# Patient Record
Sex: Male | Born: 1965 | Race: White | Hispanic: No | Marital: Married | Smoking: Never smoker
Health system: Southern US, Community
[De-identification: ages and names within clinical notes are randomized; demographics above are authoritative.]

## PROBLEM LIST (undated history)

## (undated) DIAGNOSIS — K559 Vascular disorder of intestine, unspecified: Secondary | ICD-10-CM

## (undated) DIAGNOSIS — Z98811 Dental restoration status: Secondary | ICD-10-CM

## (undated) DIAGNOSIS — Z972 Presence of dental prosthetic device (complete) (partial): Secondary | ICD-10-CM

## (undated) DIAGNOSIS — M94229 Chondromalacia, unspecified elbow: Secondary | ICD-10-CM

## (undated) DIAGNOSIS — M1990 Unspecified osteoarthritis, unspecified site: Secondary | ICD-10-CM

## (undated) DIAGNOSIS — IMO0002 Reserved for concepts with insufficient information to code with codable children: Secondary | ICD-10-CM

## (undated) DIAGNOSIS — M47817 Spondylosis without myelopathy or radiculopathy, lumbosacral region: Secondary | ICD-10-CM

## (undated) DIAGNOSIS — M67921 Unspecified disorder of synovium and tendon, right upper arm: Secondary | ICD-10-CM

## (undated) HISTORY — PX: HERNIA REPAIR: SHX51

## (undated) HISTORY — PX: INGUINAL HERNIA REPAIR: SHX194

---

## 2013-01-12 ENCOUNTER — Other Ambulatory Visit: Payer: Self-pay | Admitting: Neurosurgery

## 2013-01-12 DIAGNOSIS — M545 Low back pain: Secondary | ICD-10-CM

## 2013-01-18 ENCOUNTER — Ambulatory Visit
Admission: RE | Admit: 2013-01-18 | Discharge: 2013-01-18 | Disposition: A | Discharge status: Home / Self Care | Payer: 59 | Source: Ambulatory Visit | Attending: Neurosurgery | Admitting: Neurosurgery

## 2013-01-18 DIAGNOSIS — M545 Low back pain: Secondary | ICD-10-CM

## 2013-06-14 DIAGNOSIS — M67921 Unspecified disorder of synovium and tendon, right upper arm: Secondary | ICD-10-CM

## 2013-06-14 DIAGNOSIS — M94229 Chondromalacia, unspecified elbow: Secondary | ICD-10-CM

## 2013-06-14 HISTORY — DX: Unspecified disorder of synovium and tendon, right upper arm: M67.921

## 2013-06-14 HISTORY — DX: Chondromalacia, unspecified elbow: M94.229

## 2013-06-30 ENCOUNTER — Other Ambulatory Visit: Payer: Self-pay | Admitting: Orthopedic Surgery

## 2013-06-30 ENCOUNTER — Encounter (HOSPITAL_BASED_OUTPATIENT_CLINIC_OR_DEPARTMENT_OTHER): Payer: Self-pay | Admitting: *Deleted

## 2013-07-05 NOTE — H&P (Signed)
Kyle Ford is an 47 y.o. male.    Chief Complaint: c/o chronic and progressive lateral epicondyle pain right elbow HPI: Kyle Ford presents for evaluation of chronic right lateral elbow pain. 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 He has had severe pain at the right lateral elbow consistent with a tear of the extensor carpi radialis brevis dating back to September 2013.   At that time he was using an aerator on his yard and severely strained his elbow. He has had a very lengthy history of treatment for this. He initially presented to your office and was treated with ice, Meloxicam followed by Naproxen. He did physical therapy and has used a tennis elbow counterforce brace. He subsequently had a steroid injection by Dr. Burr Medico in December of 2013. He had pain improvement for 3 months but now has recurrent severe pain. He cannot shake hands, he cannot lift objects with his hand in a neutral or pronated position. He has had extensive physical therapy to his back, he has done an Therapist, art and has been trying also exercises advised by the Internet without relief. He has a good friend from [place] who has been a patient in our office with good results following nonoperative care of a tennis elbow predicament. He seeks an upper extremity orthopaedic consult.  Past Medical History  Diagnosis Date  . 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 Dental crowns present   . Dental bridge present     lower  . Bulging discs     lumbar  . Ischemic colitis     Past Surgical History  Procedure Laterality Date  . Inguinal hernia repair Bilateral 1980s    History reviewed. No pertinent family history. Social History:  reports that he has never smoked. He has never used smokeless tobacco. He reports that he does not drink alcohol or use illicit drugs.  Allergies:  Allergies  Allergen  Reactions  . Asa [Aspirin] Other (See Comments)    EPISTAXIS  . Penicillins Hives    No prescriptions prior to admission    No results found for this or any previous visit (from the past 48 hour(s)).  No results found.   Pertinent items are noted in HPI.  Height 5\' 6"  (1.676 m), weight 83.915 kg (185 lb).  General appearance: alert Head: Normocephalic, without obvious abnormality Neck: supple, symmetrical, trachea midline Resp: clear to auscultation bilaterally Cardio: regular rate and rhythm GI: normal findings: bowel sounds normal Extremities:  Inspection of his arm reveals no deformity. He is quite reluctant to extend the last 5 degrees to full extension. ROM with pain is 0/140 degrees. He has full pronation/supination. He has markedly painful middle finger extension test. He has pain with resisted wrist extension, long finger extension. His pain is relieved by elbow flexion at 140 degrees. Kyle Jacobson, MD He does not have pain with supinated lifting. He does not have pain on the left. His fist position, elbow extended wrist flexion is 30 degrees right and only 20 degrees of external rotation vs 70 degrees neutral approximately 50 degrees external rotation left. His neurovascular exam is intact.  X-rays of his left elbow demonstrates very slight new bone formation at the lateral epicondyle. His humeral ulnar and radiocapitellar joints are normal in appearance.  MRI of his right elbow. This was accomplished at [facility] on 06-22-13. He is noted to have cavitary  tendinopathy of his common extensor origin. He has grade I chondromalacia of the capitellum.  Pulses: 2+ and symmetric Skin: normal Neurologic: Grossly normal    Assessment/Plan Impression: Right lateral epicondylitis with irregular capitellum, rule out arthritis of radio capitellar joint.  Plan:  Reconstruction extensor origin right lateral elbow with microfracture capitellum.The procedure, risks,benefits and  post-op course were discussed with the patient at length and they were in agreement with the plan.  DASNOIT,Keana Dueitt J 07/05/2013, 10:01 PM  H&P documentation: 07/06/2013  -History and Physical Reviewed  -Patient has been re-examined  -No change in the plan of care  Wyn Forster, MD

## 2013-07-06 ENCOUNTER — Encounter (HOSPITAL_BASED_OUTPATIENT_CLINIC_OR_DEPARTMENT_OTHER)
Admission: RE | Disposition: A | Discharge status: Home / Self Care | Payer: Self-pay | Source: Ambulatory Visit | Attending: Orthopedic Surgery

## 2013-07-06 ENCOUNTER — Encounter (HOSPITAL_BASED_OUTPATIENT_CLINIC_OR_DEPARTMENT_OTHER): Payer: 59 | Admitting: Anesthesiology

## 2013-07-06 ENCOUNTER — Ambulatory Visit (HOSPITAL_BASED_OUTPATIENT_CLINIC_OR_DEPARTMENT_OTHER)
Admission: RE | Admit: 2013-07-06 | Discharge: 2013-07-06 | Disposition: A | Discharge status: Home / Self Care | Payer: 59 | Source: Ambulatory Visit | Attending: Orthopedic Surgery | Admitting: Orthopedic Surgery

## 2013-07-06 ENCOUNTER — Ambulatory Visit (HOSPITAL_BASED_OUTPATIENT_CLINIC_OR_DEPARTMENT_OTHER): Payer: 59 | Admitting: Anesthesiology

## 2013-07-06 ENCOUNTER — Encounter (HOSPITAL_BASED_OUTPATIENT_CLINIC_OR_DEPARTMENT_OTHER): Payer: Self-pay | Admitting: Anesthesiology

## 2013-07-06 DIAGNOSIS — M6580 Other synovitis and tenosynovitis, unspecified site: Secondary | ICD-10-CM | POA: Insufficient documentation

## 2013-07-06 DIAGNOSIS — M9420 Chondromalacia, unspecified site: Secondary | ICD-10-CM | POA: Insufficient documentation

## 2013-07-06 HISTORY — DX: Chondromalacia, unspecified elbow: M94.229

## 2013-07-06 HISTORY — DX: Presence of dental prosthetic device (complete) (partial): Z97.2

## 2013-07-06 HISTORY — DX: Unspecified disorder of synovium and tendon, right upper arm: M67.921

## 2013-07-06 HISTORY — DX: Vascular disorder of intestine, unspecified: K55.9

## 2013-07-06 HISTORY — DX: Dental restoration status: Z98.811

## 2013-07-06 HISTORY — PX: TENDON RECONSTRUCTION: SHX2487

## 2013-07-06 HISTORY — DX: Reserved for concepts with insufficient information to code with codable children: IMO0002

## 2013-07-06 SURGERY — RECONSTRUCTION, TENDON OR LIGAMENT, ELBOW
Anesthesia: General | Site: Elbow | Laterality: Right | Wound class: Clean

## 2013-07-06 MED ORDER — BUPIVACAINE-EPINEPHRINE PF 0.5-1:200000 % IJ SOLN
INTRAMUSCULAR | Status: DC | PRN
  Administered 2013-07-06: 35 mL

## 2013-07-06 MED ORDER — HYDROMORPHONE HCL 2 MG PO TABS: 2.0000 mg | ORAL_TABLET | ORAL | Status: DC | PRN

## 2013-07-06 MED ORDER — PROPOFOL 10 MG/ML IV BOLUS
INTRAVENOUS | Status: DC | PRN
  Administered 2013-07-06: 180 mg via INTRAVENOUS

## 2013-07-06 MED ORDER — FENTANYL CITRATE 0.05 MG/ML IJ SOLN
INTRAMUSCULAR | Status: DC | PRN
  Administered 2013-07-06: 100 ug via INTRAVENOUS

## 2013-07-06 MED ORDER — LACTATED RINGERS IV SOLN
INTRAVENOUS | Status: DC
  Administered 2013-07-06 (×2): via INTRAVENOUS

## 2013-07-06 MED ORDER — LIDOCAINE HCL 2 % IJ SOLN
INTRAMUSCULAR | Status: AC
  Filled 2013-07-06: qty 20

## 2013-07-06 MED ORDER — CHLORHEXIDINE GLUCONATE 4 % EX LIQD: 60.0000 mL | Freq: Once | CUTANEOUS | Status: DC

## 2013-07-06 MED ORDER — OXYCODONE HCL 5 MG PO TABS: 5.0000 mg | ORAL_TABLET | Freq: Once | ORAL | Status: DC | PRN

## 2013-07-06 MED ORDER — ONDANSETRON HCL 4 MG/2ML IJ SOLN
INTRAMUSCULAR | Status: DC | PRN
  Administered 2013-07-06: 4 mg via INTRAVENOUS

## 2013-07-06 MED ORDER — MIDAZOLAM HCL 2 MG/2ML IJ SOLN
1.0000 mg | INTRAMUSCULAR | Status: DC | PRN
Start: 2013-07-06 — End: 2013-07-06
  Administered 2013-07-06: 2 mg via INTRAVENOUS

## 2013-07-06 MED ORDER — DOXYCYCLINE HYCLATE 100 MG PO TABS: 100.0000 mg | ORAL_TABLET | Freq: Two times a day (BID) | ORAL | Status: AC

## 2013-07-06 MED ORDER — MIDAZOLAM HCL 2 MG/ML PO SYRP: 12.0000 mg | ORAL_SOLUTION | Freq: Once | ORAL | Status: DC | PRN

## 2013-07-06 MED ORDER — MIDAZOLAM HCL 5 MG/5ML IJ SOLN
INTRAMUSCULAR | Status: DC | PRN
  Administered 2013-07-06: 2 mg via INTRAVENOUS

## 2013-07-06 MED ORDER — PROPOFOL 10 MG/ML IV EMUL
INTRAVENOUS | Status: AC
  Filled 2013-07-06: qty 50

## 2013-07-06 MED ORDER — DEXAMETHASONE SODIUM PHOSPHATE 10 MG/ML IJ SOLN
INTRAMUSCULAR | Status: DC | PRN
  Administered 2013-07-06: 5 mg via INTRAVENOUS

## 2013-07-06 MED ORDER — HYDROMORPHONE HCL PF 1 MG/ML IJ SOLN: 0.2500 mg | INTRAMUSCULAR | Status: DC | PRN

## 2013-07-06 MED ORDER — OXYCODONE HCL 5 MG/5ML PO SOLN: 5.0000 mg | Freq: Once | ORAL | Status: DC | PRN

## 2013-07-06 MED ORDER — GLYCOPYRROLATE 0.2 MG/ML IJ SOLN: 0.1000 mg | Freq: Once | INTRAMUSCULAR | Status: DC

## 2013-07-06 MED ORDER — MIDAZOLAM HCL 2 MG/2ML IJ SOLN
INTRAMUSCULAR | Status: AC
  Filled 2013-07-06: qty 2

## 2013-07-06 MED ORDER — LIDOCAINE HCL (CARDIAC) 20 MG/ML IV SOLN
INTRAVENOUS | Status: DC | PRN
  Administered 2013-07-06: 80 mg via INTRAVENOUS

## 2013-07-06 MED ORDER — GLYCOPYRROLATE 0.2 MG/ML IJ SOLN
0.2000 mg | Freq: Once | INTRAMUSCULAR | Status: AC | PRN
  Administered 2013-07-06: 0.2 mg via INTRAVENOUS

## 2013-07-06 MED ORDER — PROMETHAZINE HCL 25 MG/ML IJ SOLN: 6.2500 mg | INTRAMUSCULAR | Status: DC | PRN

## 2013-07-06 MED ORDER — FENTANYL CITRATE 0.05 MG/ML IJ SOLN
INTRAMUSCULAR | Status: AC
  Filled 2013-07-06: qty 4

## 2013-07-06 MED ORDER — VANCOMYCIN HCL IN DEXTROSE 1-5 GM/200ML-% IV SOLN
INTRAVENOUS | Status: AC
  Filled 2013-07-06: qty 200

## 2013-07-06 MED ORDER — FENTANYL CITRATE 0.05 MG/ML IJ SOLN: 50.0000 ug | Freq: Once | INTRAMUSCULAR | Status: DC

## 2013-07-06 MED ORDER — DEXAMETHASONE SODIUM PHOSPHATE 4 MG/ML IJ SOLN
INTRAMUSCULAR | Status: DC | PRN
  Administered 2013-07-06: 4 mg

## 2013-07-06 MED ORDER — FENTANYL CITRATE 0.05 MG/ML IJ SOLN
50.0000 ug | INTRAMUSCULAR | Status: DC | PRN
  Administered 2013-07-06: 100 ug via INTRAVENOUS

## 2013-07-06 MED ORDER — MIDAZOLAM HCL 2 MG/2ML IJ SOLN
1.0000 mg | INTRAMUSCULAR | Status: DC | PRN
Start: 2013-07-06 — End: 2013-07-06

## 2013-07-06 SURGICAL SUPPLY — 82 items
BANDAGE ADHESIVE 1X3 (GAUZE/BANDAGES/DRESSINGS) IMPLANT
BANDAGE CONFORM 3  STR LF (GAUZE/BANDAGES/DRESSINGS) IMPLANT
BANDAGE ELASTIC 3 VELCRO ST LF (GAUZE/BANDAGES/DRESSINGS) ×2 IMPLANT
BANDAGE ELASTIC 4 VELCRO ST LF (GAUZE/BANDAGES/DRESSINGS) ×2 IMPLANT
BANDAGE GAUZE ELAST BULKY 4 IN (GAUZE/BANDAGES/DRESSINGS) ×2 IMPLANT
BLADE MINI RND TIP GREEN BEAV (BLADE) ×2 IMPLANT
BLADE SURG 15 STRL LF DISP TIS (BLADE) ×2 IMPLANT
BLADE SURG 15 STRL SS (BLADE) ×2
BNDG COHESIVE 3X5 TAN STRL LF (GAUZE/BANDAGES/DRESSINGS) IMPLANT
BNDG COHESIVE 4X5 TAN STRL (GAUZE/BANDAGES/DRESSINGS) IMPLANT
BNDG ESMARK 4X9 LF (GAUZE/BANDAGES/DRESSINGS) ×2 IMPLANT
BRUSH SCRUB EZ PLAIN DRY (MISCELLANEOUS) ×2 IMPLANT
CORDS BIPOLAR (ELECTRODE) ×2 IMPLANT
COVER MAYO STAND STRL (DRAPES) ×2 IMPLANT
COVER TABLE BACK 60X90 (DRAPES) ×2 IMPLANT
CUFF TOURNIQUET SINGLE 18IN (TOURNIQUET CUFF) IMPLANT
DECANTER SPIKE VIAL GLASS SM (MISCELLANEOUS) IMPLANT
DRAIN PENROSE 1/4X12 LTX STRL (WOUND CARE) IMPLANT
DRAPE EXTREMITY T 121X128X90 (DRAPE) ×2 IMPLANT
DRAPE INCISE IOBAN 66X45 STRL (DRAPES) IMPLANT
DRAPE OEC MINIVIEW 54X84 (DRAPES) IMPLANT
DRAPE SURG 17X23 STRL (DRAPES) ×2 IMPLANT
DRSG PAD ABDOMINAL 8X10 ST (GAUZE/BANDAGES/DRESSINGS) IMPLANT
DRSG TEGADERM 2-3/8X2-3/4 SM (GAUZE/BANDAGES/DRESSINGS) IMPLANT
DRSG TEGADERM 4X4.75 (GAUZE/BANDAGES/DRESSINGS) IMPLANT
GAUZE SPONGE 4X4 16PLY XRAY LF (GAUZE/BANDAGES/DRESSINGS) IMPLANT
GAUZE XEROFORM 1X8 LF (GAUZE/BANDAGES/DRESSINGS) IMPLANT
GLOVE BIO SURGEON STRL SZ 6.5 (GLOVE) ×2 IMPLANT
GLOVE BIOGEL M STRL SZ7.5 (GLOVE) ×2 IMPLANT
GLOVE BIOGEL PI IND STRL 7.0 (GLOVE) ×2 IMPLANT
GLOVE BIOGEL PI INDICATOR 7.0 (GLOVE) ×2
GLOVE ORTHO TXT STRL SZ7.5 (GLOVE) ×2 IMPLANT
GOWN BRE IMP PREV XXLGXLNG (GOWN DISPOSABLE) ×4 IMPLANT
GOWN PREVENTION PLUS XLARGE (GOWN DISPOSABLE) ×2 IMPLANT
K-WIRE .035X4 (WIRE) IMPLANT
K-WIRE .045X4 (WIRE) IMPLANT
K-WIRE .062X4 (WIRE) IMPLANT
KIT BIO-TENODESIS 3X8 DISP (MISCELLANEOUS)
KIT INSRT BABSR STRL DISP BTN (MISCELLANEOUS) IMPLANT
NDL SUT 6 .5 CRC .975X.05 MAYO (NEEDLE) IMPLANT
NEEDLE 27GAX1X1/2 (NEEDLE) IMPLANT
NEEDLE FISTULA 1/2 CIRCLE (NEEDLE) IMPLANT
NEEDLE KEITH (NEEDLE) IMPLANT
NEEDLE KEITH SZ10 STRAIGHT (NEEDLE) IMPLANT
NEEDLE MAYO TAPER (NEEDLE)
PACK BASIN DAY SURGERY FS (CUSTOM PROCEDURE TRAY) ×2 IMPLANT
PAD CAST 3X4 CTTN HI CHSV (CAST SUPPLIES) ×1 IMPLANT
PAD CAST 4YDX4 CTTN HI CHSV (CAST SUPPLIES) IMPLANT
PADDING CAST ABS 4INX4YD NS (CAST SUPPLIES) ×1
PADDING CAST ABS COTTON 4X4 ST (CAST SUPPLIES) ×1 IMPLANT
PADDING CAST COTTON 3X4 STRL (CAST SUPPLIES) ×1
PADDING CAST COTTON 4X4 STRL (CAST SUPPLIES)
PASSER SUT SWANSON 36MM LOOP (INSTRUMENTS) IMPLANT
SLEEVE SCD COMPRESS KNEE MED (MISCELLANEOUS) IMPLANT
SLING ARM FOAM STRAP LRG (SOFTGOODS) IMPLANT
SPLINT PLASTER CAST XFAST 3X15 (CAST SUPPLIES) ×5 IMPLANT
SPLINT PLASTER XTRA FASTSET 3X (CAST SUPPLIES) ×5
SPONGE GAUZE 4X4 12PLY (GAUZE/BANDAGES/DRESSINGS) ×2 IMPLANT
STOCKINETTE 4X48 STRL (DRAPES) ×2 IMPLANT
STRIP CLOSURE SKIN 1/2X4 (GAUZE/BANDAGES/DRESSINGS) IMPLANT
SUT ETHIBOND 2 OS 4 DA (SUTURE) IMPLANT
SUT FIBERWIRE #2 38 T-5 BLUE (SUTURE)
SUT FIBERWIRE 2-0 18 17.9 3/8 (SUTURE)
SUT FIBERWIRE 3-0 18 TAPR NDL (SUTURE)
SUT PROLENE 3 0 PS 2 (SUTURE) IMPLANT
SUT SILK 2 0 FS (SUTURE) IMPLANT
SUT VIC AB 2-0 SH 27 (SUTURE)
SUT VIC AB 2-0 SH 27XBRD (SUTURE) IMPLANT
SUT VIC AB 3-0 SH 27 (SUTURE)
SUT VIC AB 3-0 SH 27X BRD (SUTURE) IMPLANT
SUT VIC AB 4-0 P-3 18XBRD (SUTURE) IMPLANT
SUT VIC AB 4-0 P3 18 (SUTURE)
SUT VICRYL 4-0 PS2 18IN ABS (SUTURE) IMPLANT
SUTURE FIBERWR #2 38 T-5 BLUE (SUTURE) IMPLANT
SUTURE FIBERWR 2-0 18 17.9 3/8 (SUTURE) IMPLANT
SUTURE FIBERWR 3-0 18 TAPR NDL (SUTURE) IMPLANT
SYR 3ML 23GX1 SAFETY (SYRINGE) IMPLANT
SYR BULB 3OZ (MISCELLANEOUS) ×2 IMPLANT
SYR CONTROL 10ML LL (SYRINGE) IMPLANT
TOWEL OR 17X24 6PK STRL BLUE (TOWEL DISPOSABLE) ×4 IMPLANT
TUBE CONNECTING 20X1/4 (TUBING) IMPLANT
UNDERPAD 30X30 INCONTINENT (UNDERPADS AND DIAPERS) ×2 IMPLANT

## 2013-07-06 NOTE — Op Note (Signed)
NAME:  Kyle Ford, Kyle NO.:  [id number]  MEDICAL RECORD NO.:  [id number]  LOCATION:                                 FACILITY:  PHYSICIAN:  Katy Fitch. Jailey Booton, M.D.      DATE OF BIRTH:  DATE OF PROCEDURE:  07/06/2013 DATE OF DISCHARGE:                              OPERATIVE REPORT   PREOPERATIVE DIAGNOSES:  Chronic cavitary tendinopathy, right elbow, extensor carpi radialis brevis documented with MRI, also MRI evidence of chondromalacia of capitellum.  POSTOPERATIVE DIAGNOSES:  Minimal chondromalacia of capitellum and cavitary tendinopathy of common extensor origin documented with digital photography.  OPERATION:  Right lateral elbow arthrotomy with inspection of capitellum and proximal radial head articular cartilage followed by debridement of necrotic extensor tendon origin and reconstruction of extensor carpi radialis longus and brevis to decorticated and drilled lateral epicondyle with through bone Kevlar sutures and repair of fascia.  OPERATING SURGEON:  Katy Fitch. Shermon Bozzi, MD  ASSISTANT:  Marveen Reeks Dasnoit, PA-C  ANESTHESIA:  General by LMA supplemented by a ropivacaine right plexus block.  SUPERVISING ANESTHESIOLOGIST:  Bedelia Person, M.D.  INDICATIONS:  Kyle Ford is a 47 year old gentleman referred from [facility] for evaluation of chronic lateral elbow pain predicament.  He had been evaluated by his primary care physician in [facility] and had a single steroid injection.  He tolerated these symptoms for nearly 1 year.  He came for consult and was advised to begin an extensor carpi radialis longus and brevis stretching program and proper lifting.  After 3 months, his pain was not relieved.  A MRI was obtained, which revealed significant cavitary tendinopathy of the right common extensor origin. Due to this predicament, we recommended proceeding with primary repair of the common origin with drilling and decortication.  Due to the MRI findings of  probable chondromalacia of the capitellum, we recommended arthrotomy to check the capitellum and consider possible micro fracture.  After informed consent, he was brought to the operating room at this time.  DESCRIPTION OF PROCEDURE:  Kyle Ford was brought to the operating room, placed in supine position on the operating table.  Dr. Gypsy Balsam provided detailed anesthesia and informed consent and recommended a preoperative ropivacaine plexus block for perioperative pain management.  This was placed in the holding area without complication using ultrasound guidance.  Questions were invited and answered in detail in the holding area followed by marking the right arm as a proper surgical site per identification protocol with a marking pen.  Kyle Ford was then brought to room 1 of the [facility], placed in supine position on the operating table and general anesthesia by LMA technique induced under Dr. Burnett Corrente direct supervision.  A 1 g of vancomycin was slowly provided over 90 minutes beginning in the holding area.  Following routine Betadine scrub and paint of the right upper extremity, sterile stockinette and impervious arthroscopy drapes were applied.  The arm was then exsanguinated with Esmarch bandage and arterial tourniquet inflated to 220 mmHg.  A routine surgical time-out was accomplished followed by planning of a curvilinear incision posterior to the epicondyle paralleling the extensor carpi radialis longus and anconeus junction.  The  procedure commenced with skin incision followed by release of the forearm fascia.  The lateral epicondyle was palpated, a large reactive bone spur identified. With a 15 blade, the common extensor origin was elevated between extensor carpi radialis longus and the anconeus followed by identification of the cavity in the extensor carpi radialis brevis.  Digital photography was used to document the cavity which is approximately 1 cm by  approximately 12 mm.  The extensor carpi radialis longus and brevis were released from the epicondyle followed by removal of the osteophyte with a rongeur.  The epicondyle was drilled about 30 times with a 0.035 inch Kirschner wire. An arthrotomy of the elbow joint was accomplished by releasing the capsule and carefully ranging the elbow from full extension to 135 degrees of flexion using a headlight and Joseph skin hooks were retracting the capsule.  I did not identify any serious chondromalacia of the radial head.  There were no loose bodies present.  There is normal amount of joint fluid present.  There was some thinning of the central capitellum, but no sign of full-thickness hyaline cartilage injury.  We did not address the elbow with further surgery.  The capsulotomy was repaired by reconstruction of the common extensor origin with through bone mattress sutures through drill holes, creating an anatomic footprint of the extensor carpi radialis longus and brevis followed by over-the-top sutures insetting the margin to the posterior periosteum.  An anatomic reconstruction was achieved with the elbow in full extension, the wrist in 40 degrees of dorsiflexion.  The fascia was then repaired anatomically with a series of figure-of- eight sutures of 3-0 Vicryl followed by repair of the skin with subcutaneous 3-0 Vicryl and intradermal 3-0 Prolene with Steri-Strips.  For aftercare, Kyle Ford was placed in a wrist splint with his wrist in 40 degrees of dorsiflexion.  There were no apparent complications.  He was awakened from general anesthesia, placed in a sling and transferred to recovery room with stable signs.  For aftercare, he will begin immediate range of motion exercises to his elbow.  He will keep the splint in place for approximately 8 days.  We will then remove sutures and advance him to an elbow range of motion program in a wrist splint.     Katy Fitch Gabrielle Wakeland,  M.D.     RVS/MEDQ  D:  07/06/2013  T:  07/06/2013  Job:  [id number]

## 2013-07-06 NOTE — Anesthesia Preprocedure Evaluation (Signed)
Anesthesia Evaluation  Patient identified by MRN, date of birth, ID band Patient awake    Reviewed: Allergy & Precautions, H&P , NPO status , Patient's Chart, lab work & pertinent test results  Airway Mallampati: I TM Distance: >3 FB Neck ROM: Full    Dental   Pulmonary  breath sounds clear to auscultation        Cardiovascular Rhythm:Regular Rate:Normal     Neuro/Psych    GI/Hepatic   Endo/Other    Renal/GU      Musculoskeletal   Abdominal   Peds  Hematology   Anesthesia Other Findings   Reproductive/Obstetrics                           Anesthesia Physical Anesthesia Plan  ASA: I  Anesthesia Plan: General   Post-op Pain Management:    Induction: Intravenous  Airway Management Planned: LMA  Additional Equipment:   Intra-op Plan:   Post-operative Plan: Extubation in OR  Informed Consent: I have reviewed the patients History and Physical, chart, labs and discussed the procedure including the risks, benefits and alternatives for the proposed anesthesia with the patient or authorized representative who has indicated his/her understanding and acceptance.     Plan Discussed with: CRNA and Surgeon  Anesthesia Plan Comments:         Anesthesia Quick Evaluation  

## 2013-07-06 NOTE — Anesthesia Postprocedure Evaluation (Signed)
  Anesthesia Post-op Note  [name]  Procedure(s) Performed: Procedure(s): REPAIR EXTENSOR CARPI DADIALIS BREVIS, EXTENSOR CARPI DADIALIS LONGUS, MICROFRACTURE CAPITELLUM (Right)  Patient Location: PACU  Anesthesia Type:GA combined with regional for post-op pain  Level of Consciousness: awake and alert   Airway and Oxygen Therapy: Patient Spontanous Breathing  Post-op Pain: none  Post-op Assessment: Post-op Vital signs reviewed, Patient's Cardiovascular Status Stable, Respiratory Function Stable, Patent Airway, No signs of Nausea or vomiting and Pain level controlled  Post-op Vital Signs: Reviewed and stable  Complications: No apparent anesthesia complications

## 2013-07-06 NOTE — Op Note (Signed)
[id number] 

## 2013-07-06 NOTE — Brief Op Note (Signed)
07/06/2013  9:49 AM  PATIENT:  Kyle Ford  47 y.o. male  PRE-OPERATIVE DIAGNOSIS:  RIGHT LATERAL ELBOW CAVITARY TENDINOPATHY CHONDROMALACIA CAPITELLUM  POST-OPERATIVE DIAGNOSIS:  right elbow lateral cavitary tendonopathy chondromalacia  PROCEDURE:  RIGHT ELBOW ARTHROTOMY WITH INSPECTION OF CAPITELLUM, DEBRIDE ECRB WITH RECONSTRUCTION OF LATERAL RIGHT ELBOW  EXTENSOR TENDON ORIGIN  SURGEON:  Surgeon(s) and Role:    * Wyn Forster., MD - Primary  PHYSICIAN ASSISTANT:   ASSISTANTS: Mallory Shirk.A-C    ANESTHESIA:   general  EBL:  Total I/O In: 900 [I.V.:900] Out: -   BLOOD ADMINISTERED:none  DRAINS: none   LOCAL MEDICATIONS USED:  XYLOCAINE   SPECIMEN:  No Specimen  DISPOSITION OF SPECIMEN:  N/A  COUNTS:  YES  TOURNIQUET:  * Missing tourniquet times found for documented tourniquets in log:  [id number] *  DICTATION: .Other Dictation: Dictation Number [id number]  PLAN OF CARE: Discharge to home after PACU  PATIENT DISPOSITION:  PACU - hemodynamically stable.   Delay start of Pharmacological VTE agent (>24hrs) due to surgical blood loss or risk of bleeding: not applicable

## 2013-07-06 NOTE — Discharge Instructions (Addendum)
°  Post Anesthesia Home Care Instructions  Activity: Get plenty of rest for the remainder of the day. A responsible adult should stay with you for 24 hours following the procedure.  For the next 24 hours, DO NOT: -Drive a car -Advertising copywriter -Drink alcoholic beverages -Take any medication unless instructed by your physician -Make any legal decisions or sign important papers.  Meals: Start with liquid foods such as gelatin or soup. Progress to regular foods as tolerated. Avoid greasy, spicy, heavy foods. If nausea and/or vomiting occur, drink only clear liquids until the nausea and/or vomiting subsides. Call your physician if vomiting continues.  Special Instructions/Symptoms: Your throat may feel dry or sore from the anesthesia or the breathing tube placed in your throat during surgery. If this causes discomfort, gargle with warm salt water. The discomfort should disappear within 24 hours.     Regional Anesthesia Blocks  1. Numbness or the inability to move the "blocked" extremity may last from 3-48 hours after placement. The length of time depends on the medication injected and your individual response to the medication. If the numbness is not going away after 48 hours, call your surgeon.  2. The extremity that is blocked will need to be protected until the numbness is gone and the  Strength has returned. Because you cannot feel it, you will need to take extra care to avoid injury. Because it may be weak, you may have difficulty moving it or using it. You may not know what position it is in without looking at it while the block is in effect.  3. For blocks in the legs and feet, returning to weight bearing and walking needs to be done carefully. You will need to wait until the numbness is entirely gone and the strength has returned. You should be able to move your leg and foot normally before you try and bear weight or walk. You will need someone to be with you when you first try to  ensure you do not fall and possibly risk injury.  4. Bruising and tenderness at the needle site are common side effects and will resolve in a few days.  5. Persistent numbness or new problems with movement should be communicated to the surgeon or the [facility] [phone] [facility] [phone]).    Hand Center Instructions Hand Surgery  Wound Care: Keep your hand elevated above the level of your heart.  Do not allow it to dangle  by your side.  Keep the dressing dry and do not remove it unless your doctor advises you to do so.  He will usually change it at the time of your post-op visit.  Moving your fingers is advised to stimulate circulation but will depend on the site of your surgery.  If you have a splint applied, your doctor will advise you regarding movement.  Activity: Do not drive or operate machinery today.  Rest today and then you may return to your normal activity and work as indicated by your physician.  Diet:  Drink liquids today or eat a light diet.  You may resume a regular diet tomorrow.    General expectations: Pain for two to three days. Fingers may become slightly swollen.  Call your doctor if any of the following occur: Severe pain not relieved by pain medication. Elevated temperature. Dressing soaked with blood. Inability to move fingers. White or bluish color to fingers.  It is proper to move the elbow in flexion and extension immediately.

## 2013-07-06 NOTE — Progress Notes (Signed)
Assisted Dr. Kasik with right, ultrasound guided, interscalene  block. Side rails up, monitors on throughout procedure. See vital signs in flow sheet. Tolerated Procedure well. 

## 2013-07-06 NOTE — Transfer of Care (Signed)
Immediate Anesthesia Transfer of Care Note  [name]  Procedure(s) Performed: Procedure(s): REPAIR EXTENSOR CARPI DADIALIS BREVIS, EXTENSOR CARPI DADIALIS LONGUS, MICROFRACTURE CAPITELLUM (Right)  Patient Location: PACU  Anesthesia Type:GA combined with regional for post-op pain  Level of Consciousness: sedated  Airway & Oxygen Therapy: Patient Spontanous Breathing and Patient connected to nasal cannula oxygen  Post-op Assessment: Report given to PACU RN and Post -op Vital signs reviewed and stable  Post vital signs: Reviewed and stable  Complications: No apparent anesthesia complications

## 2013-07-06 NOTE — Anesthesia Procedure Notes (Addendum)
Anesthesia Regional Block:  Interscalene brachial plexus block  Pre-Anesthetic Checklist: ,, timeout performed, Correct Patient, Correct Site, Correct Laterality, Correct Procedure, Correct Position, site marked, Risks and benefits discussed,  Surgical consent,  Pre-op evaluation,  At surgeon's request and post-op pain management  Laterality: Right  Prep: chloraprep       Needles:  Injection technique: Single-shot  Needle Type: Echogenic Stimulator Needle     Needle Length: 5cm 5 cm Needle Gauge: 22 and 22 G    Additional Needles:  Procedures: ultrasound guided (picture in chart) and nerve stimulator Interscalene brachial plexus block  Nerve Stimulator or Paresthesia:  Response: 0.5 mA,   Additional Responses:   Narrative:  Start time: 07/06/2013 8:14 AM End time: 07/06/2013 8:23 AM Injection made incrementally with aspirations every 5 mL. Anesthesiologist: Dr Gypsy Balsam  Additional Notes: [id number] R ISB and R Supraclavicular Nerve Block Due to location of sugery both ISB and Dibble nerve blocks were utilized POP CHG prep, sterile tech Iv sedation #22 stim needle with stim down to .5ma for ISB and good [place] visualization-PIX in chart for both blocks-US only for Supraclav block Multiple neg asp Marc .5% w/epi 20cc+decadron 4mg  infiltrated for ISB and Marc .5% w/epi 15cc for Supraclav block No compl with either location Dr Gypsy Balsam   Performed by: Signa Kell C    Procedure Name: LMA Insertion Date/Time: 07/06/2013 8:42 AM Performed by: Burna Cash Pre-anesthesia Checklist: Patient identified, Emergency Drugs available, Suction available and Patient being monitored Patient Re-evaluated:Patient Re-evaluated prior to inductionOxygen Delivery Method: Circle System Utilized Preoxygenation: Pre-oxygenation with 100% oxygen Intubation Type: IV induction Ventilation: Mask ventilation without difficulty LMA: LMA inserted LMA Size: 5.0 Number of attempts: 1 Airway  Equipment and Method: bite block Placement Confirmation: positive ETCO2 Tube secured with: Tape Dental Injury: Teeth and Oropharynx as per pre-operative assessment

## 2013-07-10 ENCOUNTER — Encounter (HOSPITAL_BASED_OUTPATIENT_CLINIC_OR_DEPARTMENT_OTHER): Payer: Self-pay | Admitting: Orthopedic Surgery

## 2013-08-25 ENCOUNTER — Other Ambulatory Visit: Payer: Self-pay | Admitting: Orthopedic Surgery

## 2013-08-25 DIAGNOSIS — R52 Pain, unspecified: Secondary | ICD-10-CM

## 2013-08-25 DIAGNOSIS — R202 Paresthesia of skin: Secondary | ICD-10-CM

## 2013-09-02 ENCOUNTER — Other Ambulatory Visit: Payer: 59

## 2013-09-04 ENCOUNTER — Other Ambulatory Visit: Payer: 59

## 2013-09-05 ENCOUNTER — Other Ambulatory Visit: Payer: 59

## 2013-09-08 ENCOUNTER — Telehealth: Payer: Self-pay | Admitting: Diagnostic Neuroimaging

## 2013-09-08 NOTE — Telephone Encounter (Signed)
Called patient.  No answer.

## 2013-09-08 NOTE — Telephone Encounter (Signed)
Patient called and is wondering if he can be seen by Dr. Marjory Lies sooner than his scheduled 09/18/13 appointment. Patient wants to be seen before the end of the year. Please call patient.

## 2013-09-11 ENCOUNTER — Other Ambulatory Visit: Payer: 59

## 2013-09-11 ENCOUNTER — Ambulatory Visit
Admission: RE | Admit: 2013-09-11 | Discharge: 2013-09-11 | Disposition: A | Discharge status: Home / Self Care | Payer: 59 | Source: Ambulatory Visit | Attending: Orthopedic Surgery | Admitting: Orthopedic Surgery

## 2013-09-11 DIAGNOSIS — R202 Paresthesia of skin: Secondary | ICD-10-CM

## 2013-09-11 DIAGNOSIS — R52 Pain, unspecified: Secondary | ICD-10-CM

## 2013-09-11 NOTE — Telephone Encounter (Signed)
Patient was called and informed that Dr. Marjory Lies did not have any appointments available before the end of the year.  Patient wanted to cancel the appointment to see his primary Dr. Teressa Senter first and he will call back to rescheduled his appointment here.

## 2013-09-18 ENCOUNTER — Ambulatory Visit: Payer: 59 | Admitting: Diagnostic Neuroimaging

## 2013-11-01 ENCOUNTER — Ambulatory Visit: Payer: 59 | Admitting: Neurology

## 2014-09-18 ENCOUNTER — Ambulatory Visit: Payer: 59 | Admitting: Neurology

## 2016-12-08 ENCOUNTER — Other Ambulatory Visit: Payer: Self-pay | Admitting: Anesthesiology

## 2016-12-08 DIAGNOSIS — M5440 Lumbago with sciatica, unspecified side: Secondary | ICD-10-CM

## 2016-12-14 ENCOUNTER — Other Ambulatory Visit: Payer: Self-pay | Admitting: Family Medicine

## 2016-12-14 ENCOUNTER — Other Ambulatory Visit: Payer: 59

## 2016-12-14 ENCOUNTER — Ambulatory Visit
Admission: RE | Admit: 2016-12-14 | Discharge: 2016-12-14 | Disposition: A | Discharge status: Home / Self Care | Payer: 59 | Source: Ambulatory Visit | Attending: Anesthesiology | Admitting: Anesthesiology

## 2016-12-14 DIAGNOSIS — M25561 Pain in right knee: Secondary | ICD-10-CM

## 2016-12-14 DIAGNOSIS — M5440 Lumbago with sciatica, unspecified side: Secondary | ICD-10-CM

## 2016-12-14 DIAGNOSIS — M25521 Pain in right elbow: Secondary | ICD-10-CM

## 2018-06-08 ENCOUNTER — Other Ambulatory Visit: Payer: Self-pay | Admitting: Neurosurgery

## 2018-07-21 DIAGNOSIS — M47817 Spondylosis without myelopathy or radiculopathy, lumbosacral region: Secondary | ICD-10-CM | POA: Diagnosis not present

## 2018-07-21 DIAGNOSIS — R030 Elevated blood-pressure reading, without diagnosis of hypertension: Secondary | ICD-10-CM | POA: Diagnosis not present

## 2018-07-21 DIAGNOSIS — Z6828 Body mass index (BMI) 28.0-28.9, adult: Secondary | ICD-10-CM | POA: Diagnosis not present

## 2018-07-21 NOTE — Pre-Procedure Instructions (Signed)
Prynce Jacober  07/21/2018      [id number] - [place], [place] - [address] [place] [address] Phone: [phone] Fax: [phone]    Your procedure is scheduled on Monday November 18.  Report to [facility] Admitting at 6:00 A.M.  Call this number if you have problems the morning of surgery:  [phone]   Remember:  Do not eat or drink after midnight.    Take these medicines the morning of surgery with A SIP OF WATER: NONE  7 days prior to surgery STOP taking any Aspirin(unless otherwise instructed by your surgeon), Aleve, Naproxen, Ibuprofen, Motrin, Advil, Goody's, BC's, all herbal medications, fish oil, and all vitamins     Do not wear jewelry  Do not wear lotions, powders, or colognes, or deodorant.  Do not shave 48 hours prior to surgery.  Men may shave face and neck.  Do not bring valuables to the hospital.  [facility] is not responsible for any belongings or valuables.  Contacts, dentures or bridgework may not be worn into surgery.  Leave your suitcase in the car.  After surgery it may be brought to your room.  For patients admitted to the hospital, discharge time will be determined by your treatment team.  Patients discharged the day of surgery will not be allowed to drive home.  Special instructions:    [facility]- Preparing For Surgery  Before surgery, you can play an important role. Because skin is not sterile, your skin needs to be as free of germs as possible. You can reduce the number of germs on your skin by washing with CHG (chlorahexidine gluconate) Soap before surgery.  CHG is an antiseptic cleaner which kills germs and bonds with the skin to continue killing germs even after washing.    Oral Hygiene is also important to reduce your risk of infection.  Remember - BRUSH YOUR TEETH THE MORNING OF SURGERY WITH YOUR REGULAR TOOTHPASTE  Please do not use if you  have an allergy to CHG or antibacterial soaps. If your skin becomes reddened/irritated stop using the CHG.  Do not shave (including legs and underarms) for at least 48 hours prior to first CHG shower. It is OK to shave your face.  Please follow these instructions carefully.   1. Shower the NIGHT BEFORE SURGERY and the MORNING OF SURGERY with CHG.   2. If you chose to wash your hair, wash your hair first as usual with your normal shampoo.  3. After you shampoo, rinse your hair and body thoroughly to remove the shampoo.  4. Use CHG as you would any other liquid soap. You can apply CHG directly to the skin and wash gently with a scrungie or a clean washcloth.   5. Apply the CHG Soap to your body ONLY FROM THE NECK DOWN.  Do not use on open wounds or open sores. Avoid contact with your eyes, ears, mouth and genitals (private parts). Wash Face and genitals (private parts)  with your normal soap.  6. Wash thoroughly, paying special attention to the area where your surgery will be performed.  7. Thoroughly rinse your body with warm water from the neck down.  8. DO NOT shower/wash with your normal soap after using and rinsing off the CHG Soap.  9. Pat yourself dry with a CLEAN TOWEL.  10. Wear CLEAN PAJAMAS to bed the night before surgery, wear  comfortable clothes the morning of surgery  11. Place CLEAN SHEETS on your bed the night of your first shower and DO NOT SLEEP WITH PETS.    Day of Surgery:  Do not apply any deodorants/lotions.  Please wear clean clothes to the hospital/surgery center.   Remember to brush your teeth WITH YOUR REGULAR TOOTHPASTE.    Please read over the following fact sheets that you were given. Coughing and Deep Breathing, MRSA Information and Surgical Site Infection Prevention

## 2018-07-22 ENCOUNTER — Other Ambulatory Visit: Payer: Self-pay

## 2018-07-22 ENCOUNTER — Encounter (HOSPITAL_COMMUNITY): Payer: Self-pay

## 2018-07-22 ENCOUNTER — Encounter (HOSPITAL_COMMUNITY)
Admission: RE | Admit: 2018-07-22 | Discharge: 2018-07-22 | Disposition: A | Discharge status: Home / Self Care | Payer: BLUE CROSS/BLUE SHIELD | Source: Ambulatory Visit | Attending: Neurosurgery | Admitting: Neurosurgery

## 2018-07-22 DIAGNOSIS — Z01812 Encounter for preprocedural laboratory examination: Secondary | ICD-10-CM | POA: Insufficient documentation

## 2018-07-22 HISTORY — DX: Unspecified osteoarthritis, unspecified site: M19.90

## 2018-07-22 LAB — CBC
HEMATOCRIT: 43.3 % (ref 39.0–52.0)
HEMOGLOBIN: 13.9 g/dL (ref 13.0–17.0)
MCH: 29.8 pg (ref 26.0–34.0)
MCHC: 32.1 g/dL (ref 30.0–36.0)
MCV: 92.9 fL (ref 80.0–100.0)
NRBC: 0 % (ref 0.0–0.2)
Platelets: 248 10*3/uL (ref 150–400)
RBC: 4.66 MIL/uL (ref 4.22–5.81)
RDW: 12.2 % (ref 11.5–15.5)
WBC: 7.7 10*3/uL (ref 4.0–10.5)

## 2018-07-22 LAB — TYPE AND SCREEN
ABO/RH(D): O POS
Antibody Screen: NEGATIVE

## 2018-07-22 LAB — ABO/RH: ABO/RH(D): O POS

## 2018-07-22 LAB — SURGICAL PCR SCREEN
MRSA, PCR: NEGATIVE
STAPHYLOCOCCUS AUREUS: NEGATIVE

## 2018-07-22 MED ORDER — CHLORHEXIDINE GLUCONATE CLOTH 2 % EX PADS: 6.0000 | MEDICATED_PAD | Freq: Once | CUTANEOUS | Status: DC

## 2018-07-22 NOTE — Progress Notes (Signed)
PCP: Loyal Jacobson, MD  Cardiologist: pt denies  EKG: pt denies past year  Stress test: pt denies  ECHO: pt denies  Cardiac Cath: pt denies  Chest x-ray: pt denies past year, no recent respiratory infections/complications

## 2018-07-31 NOTE — Anesthesia Preprocedure Evaluation (Addendum)
Anesthesia Evaluation  Patient identified by MRN, date of birth, ID band Patient awake    Reviewed: Allergy & Precautions, NPO status , Patient's Chart, lab work & pertinent test results  History of Anesthesia Complications Negative for: history of anesthetic complications  Airway Mallampati: II  TM Distance: >3 FB Neck ROM: Full    Dental  (+) Dental Advisory Given   Pulmonary neg pulmonary ROS,    breath sounds clear to auscultation       Cardiovascular negative cardio ROS   Rhythm:Regular Rate:Normal     Neuro/Psych Back pain    GI/Hepatic negative GI ROS, Neg liver ROS,   Endo/Other  negative endocrine ROS  Renal/GU negative Renal ROS     Musculoskeletal negative musculoskeletal ROS (+)   Abdominal   Peds  Hematology   Anesthesia Other Findings   Reproductive/Obstetrics                             Anesthesia Physical Anesthesia Plan  ASA: I  Anesthesia Plan: General   Post-op Pain Management:    Induction: Intravenous  PONV Risk Score and Plan: 3 and Ondansetron, Dexamethasone and Scopolamine patch - Pre-op  Airway Management Planned: Oral ETT  Additional Equipment:   Intra-op Plan:   Post-operative Plan: Extubation in OR  Informed Consent: I have reviewed the patients History and Physical, chart, labs and discussed the procedure including the risks, benefits and alternatives for the proposed anesthesia with the patient or authorized representative who has indicated his/her understanding and acceptance.   Dental advisory given  Plan Discussed with: CRNA and Surgeon  Anesthesia Plan Comments: (Plan routine monitors, GETA)        Anesthesia Quick Evaluation

## 2018-08-01 ENCOUNTER — Inpatient Hospital Stay (HOSPITAL_COMMUNITY): Payer: BLUE CROSS/BLUE SHIELD | Admitting: Anesthesiology

## 2018-08-01 ENCOUNTER — Inpatient Hospital Stay (HOSPITAL_COMMUNITY): Payer: BLUE CROSS/BLUE SHIELD

## 2018-08-01 ENCOUNTER — Inpatient Hospital Stay (HOSPITAL_COMMUNITY)
Admission: RE | Disposition: A | Discharge status: Home / Self Care | Payer: Self-pay | Source: Home / Self Care | Attending: Neurosurgery

## 2018-08-01 ENCOUNTER — Other Ambulatory Visit: Payer: Self-pay

## 2018-08-01 ENCOUNTER — Encounter (HOSPITAL_COMMUNITY): Payer: Self-pay

## 2018-08-01 ENCOUNTER — Inpatient Hospital Stay (HOSPITAL_COMMUNITY)
Admission: RE | Admit: 2018-08-01 | Discharge: 2018-08-02 | DRG: 455 | Disposition: A | Discharge status: Home / Self Care | Payer: BLUE CROSS/BLUE SHIELD | Attending: Neurosurgery | Admitting: Neurosurgery

## 2018-08-01 DIAGNOSIS — M5137 Other intervertebral disc degeneration, lumbosacral region: Secondary | ICD-10-CM | POA: Diagnosis not present

## 2018-08-01 DIAGNOSIS — M47817 Spondylosis without myelopathy or radiculopathy, lumbosacral region: Secondary | ICD-10-CM | POA: Diagnosis present

## 2018-08-01 DIAGNOSIS — Z880 Allergy status to penicillin: Secondary | ICD-10-CM | POA: Diagnosis not present

## 2018-08-01 DIAGNOSIS — Z886 Allergy status to analgesic agent status: Secondary | ICD-10-CM | POA: Diagnosis not present

## 2018-08-01 DIAGNOSIS — M4807 Spinal stenosis, lumbosacral region: Secondary | ICD-10-CM | POA: Diagnosis not present

## 2018-08-01 DIAGNOSIS — M4727 Other spondylosis with radiculopathy, lumbosacral region: Secondary | ICD-10-CM | POA: Diagnosis not present

## 2018-08-01 DIAGNOSIS — M47816 Spondylosis without myelopathy or radiculopathy, lumbar region: Secondary | ICD-10-CM | POA: Diagnosis present

## 2018-08-01 DIAGNOSIS — M48061 Spinal stenosis, lumbar region without neurogenic claudication: Secondary | ICD-10-CM | POA: Diagnosis present

## 2018-08-01 DIAGNOSIS — Z981 Arthrodesis status: Secondary | ICD-10-CM | POA: Diagnosis not present

## 2018-08-01 DIAGNOSIS — M4726 Other spondylosis with radiculopathy, lumbar region: Secondary | ICD-10-CM | POA: Diagnosis not present

## 2018-08-01 DIAGNOSIS — Z419 Encounter for procedure for purposes other than remedying health state, unspecified: Secondary | ICD-10-CM

## 2018-08-01 DIAGNOSIS — M5136 Other intervertebral disc degeneration, lumbar region: Secondary | ICD-10-CM | POA: Diagnosis present

## 2018-08-01 DIAGNOSIS — M5116 Intervertebral disc disorders with radiculopathy, lumbar region: Secondary | ICD-10-CM | POA: Diagnosis not present

## 2018-08-01 HISTORY — DX: Spondylosis without myelopathy or radiculopathy, lumbosacral region: M47.817

## 2018-08-01 SURGERY — POSTERIOR LUMBAR FUSION 2 LEVEL
Anesthesia: General | Site: Back

## 2018-08-01 MED ORDER — MIDAZOLAM HCL 2 MG/2ML IJ SOLN
INTRAMUSCULAR | Status: AC
  Administered 2018-08-01: 1 mg via INTRAVENOUS
  Filled 2018-08-01: qty 2

## 2018-08-01 MED ORDER — PROPOFOL 10 MG/ML IV BOLUS
INTRAVENOUS | Status: DC | PRN
  Administered 2018-08-01: 40 mg via INTRAVENOUS
  Administered 2018-08-01: 160 mg via INTRAVENOUS

## 2018-08-01 MED ORDER — POLYETHYLENE GLYCOL 3350 17 G PO PACK: 17.0000 g | PACK | Freq: Every day | ORAL | Status: DC | PRN

## 2018-08-01 MED ORDER — THROMBIN 20000 UNITS EX SOLR
CUTANEOUS | Status: DC | PRN
  Administered 2018-08-01: 20 mL via TOPICAL

## 2018-08-01 MED ORDER — HYDROMORPHONE HCL 2 MG PO TABS: 2.0000 mg | ORAL_TABLET | ORAL | Status: DC | PRN

## 2018-08-01 MED ORDER — MENTHOL 3 MG MT LOZG: 1.0000 | LOZENGE | OROMUCOSAL | Status: DC | PRN

## 2018-08-01 MED ORDER — ONDANSETRON HCL 4 MG/2ML IJ SOLN: 4.0000 mg | Freq: Four times a day (QID) | INTRAMUSCULAR | Status: DC | PRN

## 2018-08-01 MED ORDER — LIDOCAINE 2% (20 MG/ML) 5 ML SYRINGE
INTRAMUSCULAR | Status: AC
  Filled 2018-08-01: qty 5

## 2018-08-01 MED ORDER — EPHEDRINE SULFATE-NACL 50-0.9 MG/10ML-% IV SOSY
PREFILLED_SYRINGE | INTRAVENOUS | Status: DC | PRN
  Administered 2018-08-01 (×5): 5 mg via INTRAVENOUS

## 2018-08-01 MED ORDER — HYDROMORPHONE HCL 1 MG/ML IJ SOLN
1.0000 mg | INTRAMUSCULAR | Status: DC | PRN
  Administered 2018-08-01: 1 mg via INTRAVENOUS
  Filled 2018-08-01: qty 1

## 2018-08-01 MED ORDER — ACETAMINOPHEN 325 MG PO TABS: 650.0000 mg | ORAL_TABLET | ORAL | Status: DC | PRN

## 2018-08-01 MED ORDER — PHENYLEPHRINE 40 MCG/ML (10ML) SYRINGE FOR IV PUSH (FOR BLOOD PRESSURE SUPPORT)
PREFILLED_SYRINGE | INTRAVENOUS | Status: DC | PRN
  Administered 2018-08-01: 40 ug via INTRAVENOUS
  Administered 2018-08-01 (×3): 80 ug via INTRAVENOUS

## 2018-08-01 MED ORDER — SODIUM CHLORIDE 0.9% FLUSH: 3.0000 mL | Freq: Two times a day (BID) | INTRAVENOUS | Status: DC

## 2018-08-01 MED ORDER — VANCOMYCIN HCL 1 G IV SOLR
INTRAVENOUS | Status: DC | PRN
  Administered 2018-08-01: 1000 mg

## 2018-08-01 MED ORDER — OXYCODONE HCL 5 MG PO TABS
10.0000 mg | ORAL_TABLET | ORAL | Status: DC | PRN
  Administered 2018-08-01 – 2018-08-02 (×3): 10 mg via ORAL
  Filled 2018-08-01 (×2): qty 2

## 2018-08-01 MED ORDER — SUGAMMADEX SODIUM 200 MG/2ML IV SOLN
INTRAVENOUS | Status: DC | PRN
  Administered 2018-08-01: 200 mg via INTRAVENOUS

## 2018-08-01 MED ORDER — THROMBIN 5000 UNITS EX SOLR
CUTANEOUS | Status: AC
  Filled 2018-08-01: qty 10000

## 2018-08-01 MED ORDER — THROMBIN 5000 UNITS EX SOLR
CUTANEOUS | Status: AC
  Filled 2018-08-01: qty 5000

## 2018-08-01 MED ORDER — EPHEDRINE 5 MG/ML INJ
INTRAVENOUS | Status: AC
  Filled 2018-08-01: qty 10

## 2018-08-01 MED ORDER — DEXAMETHASONE SODIUM PHOSPHATE 10 MG/ML IJ SOLN
INTRAMUSCULAR | Status: DC | PRN
  Administered 2018-08-01: 10 mg via INTRAVENOUS

## 2018-08-01 MED ORDER — FLEET ENEMA 7-19 GM/118ML RE ENEM: 1.0000 | ENEMA | Freq: Once | RECTAL | Status: DC | PRN

## 2018-08-01 MED ORDER — SODIUM CHLORIDE 0.9% FLUSH: 3.0000 mL | INTRAVENOUS | Status: DC | PRN

## 2018-08-01 MED ORDER — SODIUM CHLORIDE 0.9 % IV SOLN
INTRAVENOUS | Status: DC | PRN
  Administered 2018-08-01: 25 ug/min via INTRAVENOUS

## 2018-08-01 MED ORDER — HYDROCODONE-ACETAMINOPHEN 10-325 MG PO TABS
1.0000 | ORAL_TABLET | ORAL | Status: DC | PRN
  Administered 2018-08-01 – 2018-08-02 (×2): 1 via ORAL
  Filled 2018-08-01 (×2): qty 1

## 2018-08-01 MED ORDER — OXYCODONE HCL 5 MG PO TABS
ORAL_TABLET | ORAL | Status: AC
  Filled 2018-08-01: qty 2

## 2018-08-01 MED ORDER — ROCURONIUM BROMIDE 100 MG/10ML IV SOLN
INTRAVENOUS | Status: DC | PRN
  Administered 2018-08-01: 30 mg via INTRAVENOUS
  Administered 2018-08-01: 20 mg via INTRAVENOUS
  Administered 2018-08-01: 50 mg via INTRAVENOUS
  Administered 2018-08-01: 30 mg via INTRAVENOUS

## 2018-08-01 MED ORDER — PROMETHAZINE HCL 25 MG/ML IJ SOLN: 6.2500 mg | INTRAMUSCULAR | Status: DC | PRN

## 2018-08-01 MED ORDER — LIDOCAINE HCL (CARDIAC) PF 100 MG/5ML IV SOSY
PREFILLED_SYRINGE | INTRAVENOUS | Status: DC | PRN
  Administered 2018-08-01: 40 mg via INTRAVENOUS

## 2018-08-01 MED ORDER — BISACODYL 10 MG RE SUPP: 10.0000 mg | Freq: Every day | RECTAL | Status: DC | PRN

## 2018-08-01 MED ORDER — DIAZEPAM 5 MG PO TABS
5.0000 mg | ORAL_TABLET | Freq: Four times a day (QID) | ORAL | Status: DC | PRN
  Administered 2018-08-01: 10 mg via ORAL
  Administered 2018-08-02 (×2): 5 mg via ORAL
  Filled 2018-08-01: qty 2
  Filled 2018-08-01 (×2): qty 1

## 2018-08-01 MED ORDER — VANCOMYCIN HCL IN DEXTROSE 1-5 GM/200ML-% IV SOLN
1000.0000 mg | INTRAVENOUS | Status: AC
  Administered 2018-08-01: 1000 mg via INTRAVENOUS
  Filled 2018-08-01: qty 200

## 2018-08-01 MED ORDER — ONDANSETRON HCL 4 MG/2ML IJ SOLN
INTRAMUSCULAR | Status: DC | PRN
  Administered 2018-08-01: 4 mg via INTRAVENOUS

## 2018-08-01 MED ORDER — ONDANSETRON HCL 4 MG/2ML IJ SOLN
INTRAMUSCULAR | Status: AC
  Filled 2018-08-01: qty 2

## 2018-08-01 MED ORDER — MIDAZOLAM HCL 2 MG/2ML IJ SOLN
INTRAMUSCULAR | Status: AC
  Filled 2018-08-01: qty 2

## 2018-08-01 MED ORDER — ACETAMINOPHEN 650 MG RE SUPP: 650.0000 mg | RECTAL | Status: DC | PRN

## 2018-08-01 MED ORDER — ROCURONIUM BROMIDE 50 MG/5ML IV SOSY
PREFILLED_SYRINGE | INTRAVENOUS | Status: AC
  Filled 2018-08-01: qty 5

## 2018-08-01 MED ORDER — PHENYLEPHRINE 40 MCG/ML (10ML) SYRINGE FOR IV PUSH (FOR BLOOD PRESSURE SUPPORT)
PREFILLED_SYRINGE | INTRAVENOUS | Status: AC
  Filled 2018-08-01: qty 10

## 2018-08-01 MED ORDER — BUPIVACAINE HCL (PF) 0.25 % IJ SOLN
INTRAMUSCULAR | Status: AC
  Filled 2018-08-01: qty 30

## 2018-08-01 MED ORDER — LACTATED RINGERS IV SOLN
INTRAVENOUS | Status: DC
  Administered 2018-08-01 (×2): via INTRAVENOUS

## 2018-08-01 MED ORDER — SODIUM CHLORIDE 0.9 % IV SOLN: 250.0000 mL | INTRAVENOUS | Status: DC

## 2018-08-01 MED ORDER — VANCOMYCIN HCL IN DEXTROSE 1-5 GM/200ML-% IV SOLN
1000.0000 mg | Freq: Once | INTRAVENOUS | Status: AC
  Administered 2018-08-01: 1000 mg via INTRAVENOUS
  Filled 2018-08-01: qty 200

## 2018-08-01 MED ORDER — MIDAZOLAM HCL 2 MG/2ML IJ SOLN
0.5000 mg | Freq: Once | INTRAMUSCULAR | Status: AC | PRN
  Administered 2018-08-01: 1 mg via INTRAVENOUS

## 2018-08-01 MED ORDER — THROMBIN (RECOMBINANT) 20000 UNITS EX SOLR
CUTANEOUS | Status: AC
  Filled 2018-08-01: qty 20000

## 2018-08-01 MED ORDER — SCOPOLAMINE 1 MG/3DAYS TD PT72
1.0000 | MEDICATED_PATCH | Freq: Once | TRANSDERMAL | Status: DC
  Administered 2018-08-01: 1.5 mg via TRANSDERMAL
  Filled 2018-08-01: qty 1

## 2018-08-01 MED ORDER — HYDROMORPHONE HCL 1 MG/ML IJ SOLN
0.2500 mg | INTRAMUSCULAR | Status: DC | PRN
  Administered 2018-08-01 (×4): 0.5 mg via INTRAVENOUS

## 2018-08-01 MED ORDER — DEXAMETHASONE SODIUM PHOSPHATE 10 MG/ML IJ SOLN
INTRAMUSCULAR | Status: AC
  Filled 2018-08-01: qty 1

## 2018-08-01 MED ORDER — DOXYCYCLINE HYCLATE 100 MG PO TABS: 100.0000 mg | ORAL_TABLET | Freq: Two times a day (BID) | ORAL | Status: DC

## 2018-08-01 MED ORDER — PROPOFOL 10 MG/ML IV BOLUS
INTRAVENOUS | Status: AC
  Filled 2018-08-01: qty 40

## 2018-08-01 MED ORDER — SODIUM CHLORIDE 0.9 % IV SOLN
INTRAVENOUS | Status: DC | PRN
  Administered 2018-08-01: 11:00:00 via INTRAVENOUS

## 2018-08-01 MED ORDER — FENTANYL CITRATE (PF) 250 MCG/5ML IJ SOLN
INTRAMUSCULAR | Status: DC | PRN
  Administered 2018-08-01: 150 ug via INTRAVENOUS
  Administered 2018-08-01 (×2): 50 ug via INTRAVENOUS

## 2018-08-01 MED ORDER — PHENOL 1.4 % MT LIQD: 1.0000 | OROMUCOSAL | Status: DC | PRN

## 2018-08-01 MED ORDER — FENTANYL CITRATE (PF) 250 MCG/5ML IJ SOLN
INTRAMUSCULAR | Status: AC
  Filled 2018-08-01: qty 5

## 2018-08-01 MED ORDER — VANCOMYCIN HCL 1000 MG IV SOLR
INTRAVENOUS | Status: AC
  Filled 2018-08-01: qty 1000

## 2018-08-01 MED ORDER — SODIUM CHLORIDE 0.9 % IV SOLN
INTRAVENOUS | Status: DC | PRN
  Administered 2018-08-01: 500 mL

## 2018-08-01 MED ORDER — SUGAMMADEX SODIUM 200 MG/2ML IV SOLN
INTRAVENOUS | Status: AC
  Filled 2018-08-01: qty 2

## 2018-08-01 MED ORDER — ONDANSETRON HCL 4 MG PO TABS: 4.0000 mg | ORAL_TABLET | Freq: Four times a day (QID) | ORAL | Status: DC | PRN

## 2018-08-01 MED ORDER — BUPIVACAINE HCL (PF) 0.25 % IJ SOLN
INTRAMUSCULAR | Status: DC | PRN
  Administered 2018-08-01: 30 mL

## 2018-08-01 MED ORDER — MEPERIDINE HCL 50 MG/ML IJ SOLN: 6.2500 mg | INTRAMUSCULAR | Status: DC | PRN

## 2018-08-01 MED ORDER — 0.9 % SODIUM CHLORIDE (POUR BTL) OPTIME
TOPICAL | Status: DC | PRN
  Administered 2018-08-01: 1000 mL

## 2018-08-01 MED ORDER — MIDAZOLAM HCL 2 MG/2ML IJ SOLN
INTRAMUSCULAR | Status: DC | PRN
  Administered 2018-08-01: 2 mg via INTRAVENOUS

## 2018-08-01 MED ORDER — HYDROMORPHONE HCL 1 MG/ML IJ SOLN
INTRAMUSCULAR | Status: AC
  Administered 2018-08-01: 0.5 mg via INTRAVENOUS
  Filled 2018-08-01: qty 1

## 2018-08-01 SURGICAL SUPPLY — 72 items
BAG DECANTER FOR FLEXI CONT (MISCELLANEOUS) ×3 IMPLANT
BENZOIN TINCTURE PRP APPL 2/3 (GAUZE/BANDAGES/DRESSINGS) ×3 IMPLANT
BLADE CLIPPER SURG (BLADE) IMPLANT
BUR CUTTER 7.0 ROUND (BURR) IMPLANT
BUR MATCHSTICK NEURO 3.0 LAGG (BURR) ×3 IMPLANT
CANISTER SUCT 3000ML PPV (MISCELLANEOUS) ×3 IMPLANT
CAP LCK SPNE (Orthopedic Implant) ×6 IMPLANT
CAP LOCK SPINE RADIUS (Orthopedic Implant) ×6 IMPLANT
CAP LOCKING (Orthopedic Implant) ×12 IMPLANT
CARTRIDGE OIL MAESTRO DRILL (MISCELLANEOUS) ×1 IMPLANT
CLOSURE STERI-STRIP 1/2X4 (GAUZE/BANDAGES/DRESSINGS) ×1
CLOSURE WOUND 1/2 X4 (GAUZE/BANDAGES/DRESSINGS) ×2
CLSR STERI-STRIP ANTIMIC 1/2X4 (GAUZE/BANDAGES/DRESSINGS) ×2 IMPLANT
CONT SPEC 4OZ CLIKSEAL STRL BL (MISCELLANEOUS) ×3 IMPLANT
COVER BACK TABLE 60X90IN (DRAPES) ×3 IMPLANT
COVER WAND RF STERILE (DRAPES) ×3 IMPLANT
DECANTER SPIKE VIAL GLASS SM (MISCELLANEOUS) ×3 IMPLANT
DERMABOND ADVANCED (GAUZE/BANDAGES/DRESSINGS) ×2
DERMABOND ADVANCED .7 DNX12 (GAUZE/BANDAGES/DRESSINGS) ×1 IMPLANT
DEVICE INTERBODY ELEVATE 23X9 (Cage) ×6 IMPLANT
DEVICE INTERBODY ELEVATE 9X23 (Cage) ×6 IMPLANT
DIFFUSER DRILL AIR PNEUMATIC (MISCELLANEOUS) ×3 IMPLANT
DRAPE C-ARM 42X72 X-RAY (DRAPES) ×6 IMPLANT
DRAPE HALF SHEET 40X57 (DRAPES) ×3 IMPLANT
DRAPE LAPAROTOMY 100X72X124 (DRAPES) ×3 IMPLANT
DRAPE SURG 17X23 STRL (DRAPES) ×12 IMPLANT
DRSG OPSITE POSTOP 4X6 (GAUZE/BANDAGES/DRESSINGS) ×3 IMPLANT
DRSG OPSITE POSTOP 4X8 (GAUZE/BANDAGES/DRESSINGS) ×3 IMPLANT
DURAPREP 26ML APPLICATOR (WOUND CARE) ×3 IMPLANT
ELECT REM PT RETURN 9FT ADLT (ELECTROSURGICAL) ×3
ELECTRODE REM PT RTRN 9FT ADLT (ELECTROSURGICAL) ×1 IMPLANT
EVACUATOR 1/8 PVC DRAIN (DRAIN) IMPLANT
GAUZE 4X4 16PLY RFD (DISPOSABLE) IMPLANT
GAUZE SPONGE 4X4 12PLY STRL (GAUZE/BANDAGES/DRESSINGS) IMPLANT
GLOVE BIO SURGEON STRL SZ 6.5 (GLOVE) ×8 IMPLANT
GLOVE BIO SURGEONS STRL SZ 6.5 (GLOVE) ×4
GLOVE BIOGEL PI IND STRL 6.5 (GLOVE) ×1 IMPLANT
GLOVE BIOGEL PI IND STRL 7.0 (GLOVE) ×2 IMPLANT
GLOVE BIOGEL PI INDICATOR 6.5 (GLOVE) ×2
GLOVE BIOGEL PI INDICATOR 7.0 (GLOVE) ×4
GLOVE ECLIPSE 9.0 STRL (GLOVE) ×6 IMPLANT
GLOVE EXAM NITRILE XL STR (GLOVE) IMPLANT
GLOVE SS BIOGEL STRL SZ 6.5 (GLOVE) ×4 IMPLANT
GLOVE SUPERSENSE BIOGEL SZ 6.5 (GLOVE) ×8
GOWN STRL REUS W/ TWL LRG LVL3 (GOWN DISPOSABLE) ×3 IMPLANT
GOWN STRL REUS W/ TWL XL LVL3 (GOWN DISPOSABLE) ×3 IMPLANT
GOWN STRL REUS W/TWL 2XL LVL3 (GOWN DISPOSABLE) IMPLANT
GOWN STRL REUS W/TWL LRG LVL3 (GOWN DISPOSABLE) ×6
GOWN STRL REUS W/TWL XL LVL3 (GOWN DISPOSABLE) ×6
KIT BASIN OR (CUSTOM PROCEDURE TRAY) ×3 IMPLANT
KIT TURNOVER KIT B (KITS) ×3 IMPLANT
MILL MEDIUM DISP (BLADE) ×3 IMPLANT
NEEDLE HYPO 22GX1.5 SAFETY (NEEDLE) ×3 IMPLANT
NS IRRIG 1000ML POUR BTL (IV SOLUTION) ×3 IMPLANT
OIL CARTRIDGE MAESTRO DRILL (MISCELLANEOUS) ×3
PACK LAMINECTOMY NEURO (CUSTOM PROCEDURE TRAY) ×3 IMPLANT
PATTIES SURGICAL 1X1 (DISPOSABLE) ×3 IMPLANT
ROD 5.5X60MM PURPLE (Rod) ×3 IMPLANT
ROD 70MM (Rod) ×2 IMPLANT
ROD SPNL 70X5.5 NS TI RDS (Rod) ×1 IMPLANT
SCREW 5.75X40M (Screw) ×6 IMPLANT
SCREW 5.75X45MM (Screw) ×12 IMPLANT
SPONGE SURGIFOAM ABS GEL 100 (HEMOSTASIS) ×3 IMPLANT
STRIP CLOSURE SKIN 1/2X4 (GAUZE/BANDAGES/DRESSINGS) ×4 IMPLANT
SUT VIC AB 0 CT1 18XCR BRD8 (SUTURE) ×2 IMPLANT
SUT VIC AB 0 CT1 8-18 (SUTURE) ×4
SUT VIC AB 2-0 CT1 18 (SUTURE) ×6 IMPLANT
SUT VIC AB 3-0 SH 8-18 (SUTURE) ×6 IMPLANT
TOWEL GREEN STERILE (TOWEL DISPOSABLE) ×3 IMPLANT
TOWEL GREEN STERILE FF (TOWEL DISPOSABLE) ×3 IMPLANT
TRAY FOLEY MTR SLVR 16FR STAT (SET/KITS/TRAYS/PACK) ×3 IMPLANT
WATER STERILE IRR 1000ML POUR (IV SOLUTION) ×3 IMPLANT

## 2018-08-01 NOTE — Progress Notes (Signed)
Pharmacy Antibiotic Note  Kyle CrumbleDavid I Marolf is a 52 y.o. male admitted on 08/01/2018 for planned back surgery.  Pharmacy has been consulted for Vancomycin dosing post-op.   The patient was given Vancomycin 1g pre-op at 0700 today. There is NO drain in place per discussion with the RN.  Plan: - Vancomycin 1g IV x 1 dose at 1900 today - Pharmacy will sign off as no further doses are expected at this time  Height: 5\' 6"  (167.6 cm) Weight: 177 lb 7.5 oz (80.5 kg) IBW/kg (Calculated) : 63.8  Temp (24hrs), Avg:98.2 F (36.8 C), Min:97.7 F (36.5 C), Max:98.9 F (37.2 C)  No results for input(s): WBC, CREATININE, LATICACIDVEN, VANCOTROUGH, VANCOPEAK, VANCORANDOM, GENTTROUGH, GENTPEAK, GENTRANDOM, TOBRATROUGH, TOBRAPEAK, TOBRARND, AMIKACINPEAK, AMIKACINTROU, AMIKACIN in the last 168 hours.  CrCl cannot be calculated (No successful lab value found.).    Allergies  Allergen Reactions  . Penicillins Hives and Other (See Comments)    PATIENT HAS HAD A PCN REACTION WITH IMMEDIATE RASH, FACIAL/TONGUE/THROAT SWELLING, SOB, OR LIGHTHEADEDNESS WITH HYPOTENSION:  #  #  YES  #  #  HAS PT DEVELOPED SEVERE RASH INVOLVING MUCUS MEMBRANES or SKIN NECROSIS: #  #  YES  #  #  Has patient had a PCN reaction that required hospitalization: No Has patient had a PCN reaction occurring within the last 10 years: No   . Asa [Aspirin] Other (See Comments)    EPISTAXIS    Georgina PillionElizabeth Yoseline Andersson, PharmD, BCPS Pager: [phone] 1:18 PM

## 2018-08-01 NOTE — Op Note (Signed)
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  L4-5, L5-S1 posterior lumbar interbody fusion utilizing interbody cages and locally harvested autograft  L4-5 S1 posterior lateral arthrodesis utilizing local autograft and segmental pedicle screw fixation  Surgeon:Britany Callicott A.Jayven Naill, M.D.  Asst. Surgeon: Doran DurandBergman, NP  Anesthesia: General  Indication: 52 year old male with severe back pain with intermittent radicular symptoms failing conservative management.  Work-up demonstrates evidence of transitional anatomy at the lumbosacral junction with marked disc degeneration with associated spondylosis and severe foraminal stenosis at L4-5 and L5-S1.  Patient has failed conservative management.  He presents now for decompression and fusion surgery at both levels in hopes of improving his symptoms.  Operative note: After induction of anesthesia, patient position prone on the Wilson frame and appropriately padded.  Lumbar region prepped and draped sterilely.  Incision made overlying L4-5 and S1.  Dissection performed bilaterally.  Retractor placed.  Fluoroscopy used.  Levels confirmed.  Decompressive laminotomies and facetectomies then performed bilaterally using [facility] and high-speed drill to remove the inferior two thirds of the lamina of L4 and L5 the entire pars articularis and inferior facet of L4 and L5 bilaterally.  The majority of the superior facet of L4 and L5 bilaterally.  And the superior rim of the lamina of L5 and S1.  Ligament flavum elevated and resected.  Foraminotomies completed on the course the exiting L 4, L5 and S1 nerve roots.  Bilateral discectomies then  performed at L4-5 and L5-S1.  The spaces then prepared for interbody fusion.  With distractor was placed the patient's right side disc space was further cleaned of soft tissue.  A 9 mm extra lordotic [employer] expandable cage packed with locally harvested autograft was then impacted into place and expanded to its full extent at L5-S1.  Distractor was removed the patient's right side.  The space prepared on the right side.  Morselized autograft packed in the interspace.  A second cage packed with autograft was then impacted into place and expanded to its full extent.  Procedure was then repeated at L4-5 again without complication this time using 9 mm standard expandable cages and locally harvested autograft.  Pedicles of L4-L5 and S1 were identified using surface landmarks and intraoperative fluoroscopy.  Superficial bone overlying the pedicle was then removed using high-speed drill.  Pedicle was then probed using pedicle all each pedicle all track was then probed and found to be solidly within bone.  Each pedicle all track was then tapped with a screw tapped.  Each screw temple was probed and found to be solidly within the bone.  5.75 mm radius bran screws from [employer] were placed bilaterally at L4-L5 and S1 bilaterally.  Final images reveal good position of the cages and the hardware at the proper upper level with normal alignment of the spine.  Wound was then irrigated one final time.  Transverse processes and residual facets were decorticated.  Morselized autograft was packed posterior laterally.  Short segment titanium rod placed over the screw heads at L4-5 and S1.  Locking caps then placed over the screws.  Locking caps and engaged with a construct under compression.  Vancomycin powder was then placed in deep wound space.  Wounds and closed in layers with Vicryl sutures.  Steri-Strips  and sterile dressing were applied.  No apparent complications.  Patient tolerated the procedure well and he returns to  the recovery room postop.

## 2018-08-01 NOTE — Plan of Care (Signed)
  Problem: Pain Management: Goal: Pain level will decrease Outcome: Progressing    Problem: Bladder/Genitourinary: Goal: Urinary functional status for postoperative course will improve Outcome: Progressing   Problem: Coping: Goal: Level of anxiety will decrease Outcome: Progressing   Problem: Safety: Goal: Ability to remain free from injury will improve Outcome: Progressing

## 2018-08-01 NOTE — H&P (Signed)
Kyle Ford is an 52 y.o. male.   Chief Complaint: Back pain HPI: 52 year old male with chronic and progressive back pain with radicular symptoms into both lower extremities failing conservative management.  Work-up demonstrates evidence of transitional anatomy at the lumbosacral junction with marked disc degeneration at L4-5 and L5-S1 with severe facet arthropathy and accompanying stenosis.  Patient has failed conservative management.  He presents now for two-level lumbar decompression and fusion in hopes of improving his symptoms.  Past Medical History:  Diagnosis Date  . Arthritis   . Bulging discs    lumbar  . 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 Dental bridge present    lower  . Dental crowns present   . Ischemic colitis (HCC)   . 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 HERNIA REPAIR    . INGUINAL HERNIA REPAIR Bilateral 1980s  . TENDON RECONSTRUCTION Right 07/06/2013   Procedure: REPAIR EXTENSOR CARPI DADIALIS BREVIS, EXTENSOR CARPI DADIALIS LONGUS, MICROFRACTURE CAPITELLUM;  Surgeon: Wyn Forster., MD;  Location: [facility];  Service: Orthopedics;  Laterality: Right;    History reviewed. No pertinent family history. Social History:  reports that he has never smoked. He has never used smokeless tobacco. He reports that he does not drink alcohol or use drugs.  Allergies:  Allergies  Allergen Reactions  . Penicillins Hives and Other (See Comments)    PATIENT HAS HAD A PCN REACTION WITH IMMEDIATE RASH, FACIAL/TONGUE/THROAT SWELLING, SOB, OR LIGHTHEADEDNESS WITH HYPOTENSION:  #  #  YES  #  #  HAS PT DEVELOPED SEVERE RASH INVOLVING MUCUS MEMBRANES or SKIN NECROSIS: #  #  YES  #  #  Has patient had a PCN reaction that required hospitalization: No Has patient had a PCN reaction occurring within the last 10 years: No   . Asa  [Aspirin] Other (See Comments)    EPISTAXIS    Medications Prior to Admission  Medication Sig Dispense Refill  . doxycycline (VIBRA-TABS) 100 MG tablet Take 1 tablet (100 mg total) by mouth 2 (two) times daily. (Patient not taking: Reported on 07/19/2018) 8 tablet 0  . HYDROmorphone (DILAUDID) 2 MG tablet Take 1 tablet (2 mg total) by mouth every 4 (four) hours as needed for pain. (Patient not taking: Reported on 07/19/2018) 30 tablet 0    No results found for this or any previous visit (from the past 48 hour(s)). No results found.  Pertinent items noted in HPI and remainder of comprehensive ROS otherwise negative.  Blood pressure 137/78, pulse 81, temperature 98.9 F (37.2 C), temperature source Oral, resp. rate 18, height 5\' 6"  (1.676 m), weight 80.5 kg, SpO2 97 %.  Patient is awake and alert.  He is oriented and appropriate.  Speech is fluent.  Judgment and insight are intact.  Cranial nerve function normal bilateral.  Motor examination extremities intact.  Sensory examination nonfocal.  Reflexes normal active.  No evidence of long track signs.  Gait somewhat antalgic.  Posture normal.  Examination head ears eyes nose throat is unremarkable her chest and abdomen are benign.  Extremities are free from injury or deformity. Assessment/Plan L4-5, L5-S1 degenerative disc disease with severe facet arthropathy and chronic lateral recess and foraminal stenosis.  Plan bilateral L4-5 and L5-S1 decompressive laminotomies and foraminotomies followed by posterior lumbar interbody fusion utilizing interbody cages, local harvested autograft, and posterior water  arthrodesis utilizing nonsegmental pedicle screw fixation and local autografting.  Risks and benefits of been explained.  Patient wishes to proceed.  [name] 08/01/2018, 7:44 AM

## 2018-08-01 NOTE — Anesthesia Postprocedure Evaluation (Signed)
Anesthesia Post Note  Patient: Juanda CrumbleDavid I Harpole  Procedure(s) Performed: Lumbar four-five; Lumbar five Sacral one Posterior lumbar interbody fusion (N/A Back)     Patient location during evaluation: PACU Anesthesia Type: General Level of consciousness: awake and alert, oriented and patient cooperative Pain management: pain level controlled Vital Signs Assessment: post-procedure vital signs reviewed and stable Respiratory status: spontaneous breathing, nonlabored ventilation and respiratory function stable Cardiovascular status: blood pressure returned to baseline and stable Postop Assessment: no apparent nausea or vomiting Anesthetic complications: no    Last Vitals:  Vitals:   08/01/18 1215 08/01/18 1243  BP:  109/65  Pulse:  63  Resp:  20  Temp: 36.5 C 36.8 C  SpO2:  96%    Last Pain:  Vitals:   08/01/18 1243  TempSrc: Oral  PainSc:                  Jonatha Gagen,E. Machel Violante

## 2018-08-01 NOTE — Brief Op Note (Signed)
08/01/2018  10:58 AM  PATIENT:  Juanda Crumbleavid I Dalporto  52 y.o. 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   Delay start of Pharmacological VTE agent (>24hrs) due to surgical blood loss or risk of bleeding: yes

## 2018-08-01 NOTE — Anesthesia Procedure Notes (Signed)
Procedure Name: Intubation Date/Time: 08/01/2018 8:01 AM Performed by: Raenette Rover, CRNA Pre-anesthesia Checklist: Patient identified, Emergency Drugs available, Suction available and Patient being monitored Patient Re-evaluated:Patient Re-evaluated prior to induction Oxygen Delivery Method: Circle system utilized Preoxygenation: Pre-oxygenation with 100% oxygen Induction Type: IV induction Ventilation: Mask ventilation without difficulty Laryngoscope Size: Mac and 3 Grade View: Grade I Tube type: Oral Tube size: 7.5 mm Number of attempts: 1 Airway Equipment and Method: Stylet Placement Confirmation: ETT inserted through vocal cords under direct vision,  positive ETCO2,  CO2 detector and breath sounds checked- equal and bilateral Secured at: 22 cm Tube secured with: Tape Dental Injury: Teeth and Oropharynx as per pre-operative assessment

## 2018-08-01 NOTE — Transfer of Care (Signed)
Immediate Anesthesia Transfer of Care Note  [name]  Procedure(s) Performed: Lumbar four-five; Lumbar five Sacral one Posterior lumbar interbody fusion (N/A Back)  Patient Location: PACU  Anesthesia Type:General  Level of Consciousness: sedated and drowsy  Airway & Oxygen Therapy: Patient Spontanous Breathing and Patient connected to face mask oxygen  Post-op Assessment: Report given to RN and Post -op Vital signs reviewed and stable  Post vital signs: Reviewed and stable  Last Vitals:  Vitals Value Taken Time  BP 111/63 08/01/2018 11:08 AM  Temp    Pulse 55 08/01/2018 11:09 AM  Resp 8 08/01/2018 11:09 AM  SpO2 100 % 08/01/2018 11:09 AM  Vitals shown include unvalidated device data.  Last Pain:  Vitals:   08/01/18 0652  TempSrc:   PainSc: 0-No pain         Complications: No apparent anesthesia complications

## 2018-08-02 MED ORDER — OXYCODONE HCL 10 MG PO TABS: 5.0000 mg | ORAL_TABLET | ORAL | 0 refills | Status: AC | PRN

## 2018-08-02 MED ORDER — DIAZEPAM 5 MG PO TABS: 5.0000 mg | ORAL_TABLET | Freq: Four times a day (QID) | ORAL | 0 refills | Status: AC | PRN

## 2018-08-02 MED FILL — Thrombin (Recombinant) For Soln 20000 Unit: CUTANEOUS | Qty: 1 | Status: AC

## 2018-08-02 MED FILL — Heparin Sodium (Porcine) Inj 1000 Unit/ML: INTRAMUSCULAR | Qty: 30 | Status: AC

## 2018-08-02 MED FILL — Sodium Chloride IV Soln 0.9%: INTRAVENOUS | Qty: 1000 | Status: AC

## 2018-08-02 NOTE — Progress Notes (Signed)
Patient is discharged from room 3C07 at this time. Alert and in stable condition. IV site d/c'd and instructions read to patient and spouse with understanding verbalized. Left unit via wheelchair with all belongings at side. 

## 2018-08-02 NOTE — Evaluation (Signed)
Occupational Therapy Evaluation Patient Details Name: Kyle Ford MRN: [id number] DOB: 09-24-1965 Today's Date: 08/02/2018    History of Present Illness Pt is a 52 y/o male s/p L 4-5, L5-S1 posterior lumbar interbody fusion due to chronic and progressive back pain with radicular symptoms failing conservative management.  PMH signifincat for but not limited to: bulging disc lumbar, ischemic colitis, arthritis.    Clinical Impression   PTA patient independent and working, limited by pain.  Patient currently admitted for above and limited by problem list below.  Patient requires supervision for transfers and bed mobility, supervision for grooming, mod assist for LB ADLs.  Patient educated on precautions, brace mgmt and wear schedule, safety, ADL compensatory techniques and mobility.  Patient reports spouse will provide 24/7 support at home, assisting with LB self care as needed.  At this time, no further OT needs identified.  Thank you for this referral.      Follow Up Recommendations  No OT follow up;Supervision - Intermittent    Equipment Recommendations  3 in 1 bedside commode    Recommendations for Other Services PT consult     Precautions / Restrictions Precautions Precautions: Back;Fall(moderate fall risk) Precaution Booklet Issued: Yes (comment) Precaution Comments: reviewed precautions with patient, able to recall 3/3  Required Braces or Orthoses: Spinal Brace Spinal Brace: Lumbar corset;Applied in sitting position Restrictions Weight Bearing Restrictions: No      Mobility Bed Mobility Overal bed mobility: Needs Assistance Bed Mobility: Rolling;Sidelying to Sit Rolling: Supervision Sidelying to sit: Supervision       General bed mobility comments: supervision to adhere to spinal precautions  Transfers Overall transfer level: Needs assistance Equipment used: None Transfers: Sit to/from Stand Sit to Stand: Supervision         General transfer comment:  supervision for safety and adherance to precautions    Balance Overall balance assessment: No apparent balance deficits (not formally assessed)                                         ADL either performed or assessed with clinical judgement   ADL Overall ADL's : Needs assistance/impaired     Grooming: Supervision/safety;Standing Grooming Details (indicate cue type and reason): cueing for compensatory technique to adhere to back precautions  Upper Body Bathing: Set up;Sitting   Lower Body Bathing: Minimal assistance;Sitting/lateral leans;Cueing for back precautions;Cueing for compensatory techniques Lower Body Bathing Details (indicate cue type and reason): educated on back precautions and techniques seated, spouse plans to assist with LB as pt unable to complete figure 4 technique Upper Body Dressing : Set up;Sitting   Lower Body Dressing: Moderate assistance;Sit to/from stand;Cueing for back precautions;Cueing for compensatory techniques Lower Body Dressing Details (indicate cue type and reason): pt reports spouse with assist, reviewed compensatory techniques and back precuations  Toilet Transfer: Supervision/safety;Ambulation;Grab bars;Regular Social workerToilet   Toileting- Clothing Manipulation and Hygiene: Supervision/safety;Sit to/from stand   Tub/ Shower Transfer: Supervision/safety;Ambulation;3 in 1 Tub/Shower Transfer Details (indicate cue type and reason): reviewed safety and technqiues to adhere to precautions  Functional mobility during ADLs: Supervision/safety General ADL Comments: pt educated on body mechanics, back precautions and ADl compenstory techniques     Vision   Vision Assessment?: No apparent visual deficits     Perception     Praxis      Pertinent Vitals/Pain Pain Assessment: 0-10 Pain Score: 6  Pain Location: back incision Pain Descriptors /  Indicators: Discomfort;Grimacing;Operative site guarding Pain Intervention(s): Limited activity  within patient's tolerance;Repositioned;Monitored during session     Hand Dominance     Extremity/Trunk Assessment Upper Extremity Assessment Upper Extremity Assessment: Overall WFL for tasks assessed   Lower Extremity Assessment Lower Extremity Assessment: Defer to PT evaluation   Cervical / Trunk Assessment Cervical / Trunk Assessment: Other exceptions Cervical / Trunk Exceptions: s/p lumbar sx    Communication Communication Communication: No difficulties   Cognition Arousal/Alertness: Awake/alert Behavior During Therapy: WFL for tasks assessed/performed Overall Cognitive Status: Within Functional Limits for tasks assessed                                     General Comments       Exercises     Shoulder Instructions      Home Living Family/patient expects to be discharged to:: Private residence Living Arrangements: Spouse/significant other Available Help at Discharge: Family;Available 24 hours/day Type of Home: House Home Access: Stairs to enter [employer] of Steps: 2   Home Layout: Two level;Bed/bath upstairs Alternate Level Stairs-Number of Steps: 16   Bathroom Shower/Tub: Producer, television/film/video: Standard     Home Equipment: Environmental consultant - 2 wheels          Prior Functioning/Environment Level of Independence: Independent        Comments: independent and working, limited by pain         OT Problem List: Decreased activity tolerance;Decreased safety awareness;Decreased knowledge of use of DME or AE;Decreased knowledge of precautions;Pain      OT Treatment/Interventions:      OT Goals(Current goals can be found in the care plan section) Acute Rehab OT Goals Patient Stated Goal: to get home, have less pain OT Goal Formulation: With patient  OT Frequency:     Barriers to D/C:            Co-evaluation              AM-PAC PT "6 Clicks" Daily Activity     Outcome Measure Help from another person eating  meals?: None Help from another person taking care of personal grooming?: None Help from another person toileting, which includes using toliet, bedpan, or urinal?: None Help from another person bathing (including washing, rinsing, drying)?: A Little Help from another person to put on and taking off regular upper body clothing?: None Help from another person to put on and taking off regular lower body clothing?: A Lot 6 Click Score: 21   End of Session Equipment Utilized During Treatment: Back brace Nurse Communication: Mobility status  Activity Tolerance: Patient tolerated treatment well Patient left: with call bell/phone within reach;Other (comment)(seated EOB)  OT Visit Diagnosis: Unsteadiness on feet (R26.81);Pain Pain - part of body: (back incision)                Time: 4540-9811 OT Time Calculation (min): 21 min Charges:  OT General Charges $OT Visit: 1 Visit OT Evaluation $OT Eval Low Complexity: 1 Low  Chancy Milroy, OT Acute Rehabilitation Services Pager [phone] Office [phone].   Nadean Corwin Jahmire Ruffins 08/02/2018, 8:16 AM

## 2018-08-02 NOTE — Evaluation (Signed)
Physical Therapy Evaluation and Discharge Patient Details Name: Kyle Ford MRN: [id number] DOB: [birth year] Today's Date: 08/02/2018   History of Present Illness  Pt is a 52 y/o male s/p L 4-5, L5-S1 posterior lumbar interbody fusion due to chronic and progressive back pain with radicular symptoms failing conservative management.  PMH signifincat for but not limited to: bulging disc lumbar, ischemic colitis, arthritis.   Clinical Impression  Patient evaluated by Physical Therapy with no further acute PT needs identified. All education has been completed and the patient has no further questions. At the time of PT eval pt was able to perform transfers and ambulation with gross modified independence to supervision for safety. Pt was educated on car transfer, brace application/wearing schedule, stair negotiations, activity progression, and positioning recommendations. See below for any follow-up Physical Therapy or equipment needs. PT is signing off. Thank you for this referral.     Follow Up Recommendations No PT follow up;Supervision for mobility/OOB    Equipment Recommendations  None recommended by PT    Recommendations for Other Services       Precautions / Restrictions Precautions Precautions: Back Precaution Booklet Issued: Yes (comment) Precaution Comments: Pt was educated on maintenance of precautions during functional mobility.  Required Braces or Orthoses: Spinal Brace Spinal Brace: Lumbar corset;Applied in sitting position Restrictions Weight Bearing Restrictions: No      Mobility  Bed Mobility Overal bed mobility: Modified Independent Bed Mobility: Rolling;Sit to Sidelying Rolling: Modified independent (Device/Increase time) Sidelying to sit: Supervision     Sit to sidelying: Modified independent (Device/Increase time) General bed mobility comments: HOB flat and rails lowered to simulate home environment. No assist required.   Transfers Overall transfer level:  Needs assistance Equipment used: None Transfers: Sit to/from Stand Sit to Stand: Supervision         General transfer comment: Light supervision for adherence to precautions. Pt was able to stand with increased time and no physical assistance from therapist  Ambulation/Gait Ambulation/Gait assistance: Modified independent (Device/Increase time) Gait Distance (Feet): 250 Feet Assistive device: None Gait Pattern/deviations: Step-through pattern;Decreased stride length Gait velocity: Decreased Gait velocity interpretation: 1.31 - 2.62 ft/sec, indicative of limited community ambulator General Gait Details: Slow and guarded due to pain and possibly anticipation of pain as well. No unsteadiness or LOB noted.   Stairs Stairs: Yes Stairs assistance: Supervision Stair Management: One rail Left;Step to pattern;Forwards Number of Stairs: 10 General stair comments: VC's for sequencing and general safety. No assist required.   Wheelchair Mobility    Modified Rankin (Stroke Patients Only)       Balance Overall balance assessment: No apparent balance deficits (not formally assessed)                                           Pertinent Vitals/Pain Pain Assessment: Faces Pain Score: 6  Faces Pain Scale: Hurts even more Pain Location: back incision Pain Descriptors / Indicators: Discomfort;Grimacing;Operative site guarding Pain Intervention(s): Limited activity within patient's tolerance;Repositioned;Monitored during session    Home Living Family/patient expects to be discharged to:: Private residence Living Arrangements: Spouse/significant other Available Help at Discharge: Family;Available 24 hours/day Type of Home: House Home Access: Stairs to enter   [employer] of Steps: 2 Home Layout: Two level;Bed/bath upstairs Home Equipment: Walker - 2 wheels      Prior Function Level of Independence: Independent  Comments: independent and  working, limited by pain      Hand Dominance   Dominant Hand: Right    Extremity/Trunk Assessment   Upper Extremity Assessment Upper Extremity Assessment: Overall WFL for tasks assessed    Lower Extremity Assessment Lower Extremity Assessment: Generalized weakness(LLE little weaker; Consistent with pre-op diagnosis)    Cervical / Trunk Assessment Cervical / Trunk Assessment: Other exceptions Cervical / Trunk Exceptions: s/p lumbar sx   Communication   Communication: No difficulties  Cognition Arousal/Alertness: Awake/alert Behavior During Therapy: WFL for tasks assessed/performed Overall Cognitive Status: Within Functional Limits for tasks assessed                                        General Comments      Exercises     Assessment/Plan    PT Assessment Patent does not need any further PT services  PT Problem List         PT Treatment Interventions      PT Goals (Current goals can be found in the Care Plan section)  Acute Rehab PT Goals Patient Stated Goal: to get home, have less pain PT Goal Formulation: All assessment and education complete, DC therapy    Frequency     Barriers to discharge        Co-evaluation               AM-PAC PT "6 Clicks" Daily Activity  Outcome Measure Difficulty turning over in bed (including adjusting bedclothes, sheets and blankets)?: None Difficulty moving from lying on back to sitting on the side of the bed? : None Difficulty sitting down on and standing up from a chair with arms (e.g., wheelchair, bedside commode, etc,.)?: A Little Help needed moving to and from a bed to chair (including a wheelchair)?: None Help needed walking in hospital room?: None Help needed climbing 3-5 steps with a railing? : A Little 6 Click Score: 22    End of Session Equipment Utilized During Treatment: Gait belt;Back brace Activity Tolerance: Patient tolerated treatment well Patient left: in bed;with call bell/phone  within reach Nurse Communication: Mobility status PT Visit Diagnosis: Unsteadiness on feet (R26.81);Pain Pain - part of body: (back)    Time: [id number] PT Time Calculation (min) (ACUTE ONLY): 24 min   Charges:   PT Evaluation $PT Eval Moderate Complexity: 1 Mod PT Treatments $Gait Training: 8-22 mins        Conni SlipperLaura Markon Jares, PT, DPT Acute Rehabilitation Services Pager: [phone] Office: [phone]   Marylynn PearsonLaura D Naviyah Schaffert 08/02/2018, 9:59 AM

## 2018-08-02 NOTE — Discharge Summary (Signed)
`  Physician Discharge Summary  Patient ID: Kyle Ford MRN: [id number] Kyle CrumbleDOB/AGE: 1966/03/13 52 y.o.  Admit date: 08/01/2018 Discharge date: 08/02/2018  Admission Diagnoses:  Discharge Diagnoses:  Active Problems:   Lumbar foraminal stenosis   Discharged Condition: good  Hospital Course: Patient admitted to the hospital where he underwent uncomplicated two-level lumbar decompression and fusion.  Postoperatively doing very well.  Preoperative back and lower extremity pain improved.  Standing and walking without difficulty.  Ready for discharge home.  Consults:   Significant Diagnostic Studies:   Treatments:   Discharge Exam: Blood pressure (!) 90/51, pulse 71, temperature 98.2 F (36.8 C), temperature source Oral, resp. rate 18, height 5\' 6"  (1.676 m), weight 80.5 kg, SpO2 98 %. Awake and alert.  Oriented and appropriate.  Cranial nerve function intact.  Motor and sensory function of the extremities normal.  Wound clean and dry.  Chest and abdomen benign.  Disposition: Discharge disposition: 01-Home or Self Care        Allergies as of 08/02/2018      Reactions   Penicillins Hives, Other (See Comments)   PATIENT HAS HAD A PCN REACTION WITH IMMEDIATE RASH, FACIAL/TONGUE/THROAT SWELLING, SOB, OR LIGHTHEADEDNESS WITH HYPOTENSION:  #  #  YES  #  #  HAS PT DEVELOPED SEVERE RASH INVOLVING MUCUS MEMBRANES or SKIN NECROSIS: #  #  YES  #  #  Has patient had a PCN reaction that required hospitalization: No Has patient had a PCN reaction occurring within the last 10 years: No   Asa [aspirin] Other (See Comments)   EPISTAXIS      Medication List    STOP taking these medications   HYDROmorphone 2 MG tablet Commonly known as:  DILAUDID     TAKE these medications   diazepam 5 MG tablet Commonly known as:  VALIUM Take 1-2 tablets (5-10 mg total) by mouth every 6 (six) hours as needed for muscle spasms.   doxycycline 100 MG tablet Commonly known as:  VIBRA-TABS Take 1 tablet  (100 mg total) by mouth 2 (two) times daily.   Oxycodone HCl 10 MG Tabs Take 0.5-1 tablets (5-10 mg total) by mouth every 3 (three) hours as needed for severe pain ((score 7 to 10)).            Durable Medical Equipment  (From admission, onward)         Start     Ordered   08/01/18 1303  DME Walker rolling  Once    Question:  Patient needs a walker to treat with the following condition  Answer:  Lumbar foraminal stenosis   08/01/18 1302   08/01/18 1303  DME 3 n 1  Once     08/01/18 1302           Signed: Sherilyn CooterHenry A Keshonda Monsour 08/02/2018, 11:33 AM

## 2018-08-02 NOTE — Discharge Instructions (Signed)

## 2018-09-22 DIAGNOSIS — M47817 Spondylosis without myelopathy or radiculopathy, lumbosacral region: Secondary | ICD-10-CM | POA: Diagnosis not present

## 2018-10-20 DIAGNOSIS — M47817 Spondylosis without myelopathy or radiculopathy, lumbosacral region: Secondary | ICD-10-CM | POA: Diagnosis not present

## 2018-10-27 ENCOUNTER — Encounter: Payer: Self-pay | Admitting: Physical Therapy

## 2018-10-27 ENCOUNTER — Ambulatory Visit: Payer: BLUE CROSS/BLUE SHIELD | Attending: Neurosurgery | Admitting: Physical Therapy

## 2018-10-27 ENCOUNTER — Encounter

## 2018-10-27 ENCOUNTER — Other Ambulatory Visit: Payer: Self-pay

## 2018-10-27 DIAGNOSIS — M25561 Pain in right knee: Secondary | ICD-10-CM | POA: Diagnosis not present

## 2018-10-27 DIAGNOSIS — G8929 Other chronic pain: Secondary | ICD-10-CM | POA: Diagnosis not present

## 2018-10-27 DIAGNOSIS — M545 Low back pain, unspecified: Secondary | ICD-10-CM

## 2018-10-27 DIAGNOSIS — M25661 Stiffness of right knee, not elsewhere classified: Secondary | ICD-10-CM | POA: Insufficient documentation

## 2018-10-27 DIAGNOSIS — R29898 Other symptoms and signs involving the musculoskeletal system: Secondary | ICD-10-CM | POA: Diagnosis not present

## 2018-10-27 DIAGNOSIS — M6281 Muscle weakness (generalized): Secondary | ICD-10-CM | POA: Diagnosis not present

## 2018-10-27 NOTE — Therapy (Signed)
[facility] [address]  [address] [place], [place], [address] Phone: [phone]   Fax:  [phone]  Physical Therapy Evaluation  Patient Details  Name: Kyle Ford MRN: [id number] Date of Birth: 03-07-1966 Referring Provider (PT): Julio Sicks, MD   Encounter Date: 10/27/2018  PT End of Session - 10/27/18 1753    Visit Number  1    Number of Visits  17    Date for PT Re-Evaluation  12/22/18    Authorization Type  BCBS: VL 30    PT Start Time  1701    PT Stop Time  1745    PT Time Calculation (min)  44 min    Activity Tolerance  Patient tolerated treatment well    Behavior During Therapy  [facility] for tasks assessed/performed       Past Medical History:  Diagnosis Date  . Arthritis   . Bulging discs    lumbar  . 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 Dental bridge present    lower  . Dental crowns present   . Ischemic colitis (HCC)   . 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 HERNIA REPAIR    . INGUINAL HERNIA REPAIR Bilateral 1980s  . TENDON RECONSTRUCTION Right 07/06/2013   Procedure: REPAIR EXTENSOR CARPI DADIALIS BREVIS, EXTENSOR CARPI DADIALIS LONGUS, MICROFRACTURE CAPITELLUM;  Surgeon: Wyn Forster., MD;  Location: [facility];  Service: Orthopedics;  Laterality: Right;    There were no vitals filed for this visit.   Subjective Assessment - 10/27/18 1703    Subjective  Patient reports undergoing L4-5 and L5-S1 fusion on 08/01/18. Pain since then has been mild. Did have some L buttock pain with hip flexion but has since resolved. MD placed him on lifting restriction of 20-30 lbs. Currently having trouble with putting shoes on d/t stiffness in back, pressure with STS, and waking up at night from sensation of pressure/tightness in LB. No increased pain with  prolonged sitting, standing, walking. No easing factors. Patient continues to walk 3 miles a day and uses elliptical.    Pertinent History  tendinopathy of R elbow, ischemic colitis, chondromalacia of elbow, R ECRB tendon reconstruction     Limitations  House hold activities   sleeping   How long can you sit comfortably?  unlimited    How long can you stand comfortably?  unlimited    How long can you walk comfortably?  unlimited    Diagnostic tests  08/01/18 lumbar xray: L4-5 and L5-S1 PLIF without radiographic evidence for complication.    Patient Stated Goals  more flexibility    Currently in Pain?  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   No    Is the patient reluctant to leave their home because of a fear of falling?   No      Home Environment   Living Environment  Private residence    Type of Home  House    Home Access  Stairs to enter    Entrance Stairs-Number of Steps  2    Entrance Stairs-Rails  None    Home Layout  Two level    Alternate Level Stairs-Number of Steps  9    Alternate Level Stairs-Rails  Right      Prior Function   Level of Independence  Independent    Vocation  Full time employment    Vocation Requirements  lifting computers- 20lbs   currently avoiding these tasks d/t restrictions   Leisure  home  remodeling- getting up and down from floor      Cognition   Overall Cognitive Status  Within Functional Limits for tasks assessed      Observation/Other Assessments   Focus on Therapeutic Outcomes (FOTO)   Lumbar: 57 (43% limited, 30% predicted)      Sensation   Light Touch  Appears Intact      Coordination   Gross Motor Movements are Fluid and Coordinated  Yes      Posture/Postural Control   Posture/Postural Control  Postural limitations    Postural Limitations  Rounded Shoulders;Forward head      ROM / Strength   AROM / PROM / Strength  Strength      Strength   Strength Assessment Site  Hip;Knee;Ankle    Right/Left Hip  Right;Left    Right Hip Flexion  4+/5   pulling in LB   Right Hip ABduction  4/5    Right Hip ADduction  4/5    Left Hip Flexion  4+/5    Left Hip ABduction  4/5    Left Hip ADduction  4/5    Right/Left Knee  Right;Left    Right Knee Flexion  4+/5    Right Knee Extension  4+/5    Left Knee Flexion  4/5    Left Knee Extension  4+/5    Right/Left Ankle  Right;Left    Right Ankle Dorsiflexion  4/5    Right Ankle Plantar Flexion  5/5    Left Ankle Dorsiflexion  4+/5    Left Ankle Plantar Flexion  5/5      Flexibility   Soft Tissue Assessment /Muscle Length  yes    Hamstrings  B severely tight- worse on L    Quadriceps  mod thomas mildly tight- limited by LBP    Piriformis  B moderately tight      Palpation   Palpation comment  TTP along B lower lumbar paraspinals and superior glutes      Ambulation/Gait   Assistive device  None    Gait Pattern  Step-through pattern;Within Functional Limits    Ambulation Surface  Level;Indoor    Gait velocity  WFL                Objective measurements completed on examination: See above findings.  PT Education - 10/27/18 1753    Education Details  prognosis, POC, HEP    Person(s) Educated  Patient    Methods  Explanation;Demonstration;Tactile cues;Verbal cues;Handout     Comprehension  Verbalized understanding;Returned demonstration       PT Short Term Goals - 10/27/18 1800      PT SHORT TERM GOAL #1   Title  Patient to be independent with initial HEP.    Time  4    Period  Weeks    Status  New    Target Date  11/24/18        PT Long Term Goals - 10/27/18 1800      PT LONG TERM GOAL #1   Title  Patient to be independent with advanced HEP.    Time  8    Period  Weeks    Status  New    Target Date  12/22/18      PT LONG TERM GOAL #2   Title  Patient to demonstrate 5/5 strength in B LEs.    Time  8    Period  Weeks    Status  New    Target Date  12/22/18      PT LONG TERM GOAL #3   Title  Patient to demonstrate good body mechanics and no pain when lifting 20lb box from ground.    Time  8    Period  Weeks    Status  New    Target Date  12/22/18      PT LONG TERM GOAL #4   Title  Patient to demonstrate mild tightness in B HS, piriformis, hip flexor/quad.     Time  8    Period  Weeks    Status  New    Target Date  12/22/18      PT LONG TERM GOAL #5   Title  Patient to return to full work duties without pain.     Time  8    Period  Weeks    Status  New    Target Date  12/22/18             Plan - 10/27/18 1754    Clinical Impression Statement  Patient is a 52y/o M presenting to OPPT with c/o central LB stiffness after L4-5, L5-S1 posterior interbody fusion on 08/01/18. Notes resolution of pain, but with remaining stiffness with putting on shoes, getting up from sitting, and waking up with pressure/stiffness at night. Patient is active and continues to walk and use elliptical. Currently not back to full work duties d/t 20-30lb lifting restriction. Patient today with limited B hip strength, TTP along B lower lumbar paraspinals and superior glutes, limited lumbopelvic ROM with pelvic tilts, decreased B flexibility in HS, hip flexors/quads, and piriformis. Educated on gentle stretching and ROM HEP- patient reported understanding.  Would benefit from skilled PT services 2x/week for 8 weeks to address aforementioned impairments.     Clinical Presentation  Stable    Clinical Decision Making  Low    Rehab Potential  Good    Clinical Impairments Affecting Rehab Potential  tendinopathy of R elbow, ischemic colitis, chondromalacia of elbow, R ECRB tendon reconstruction     PT Frequency  2x / week    PT Duration  8 weeks    PT Treatment/Interventions  ADLs/Self Care Home Management;Cryotherapy;Electrical Stimulation;Functional mobility training;Stair training;Gait training;DME Instruction;Ultrasound;Moist Heat;Therapeutic activities;Therapeutic exercise;Balance training;Neuromuscular re-education;Patient/family education;Orthotic Fit/Training;Passive range of motion;Scar mobilization;Manual techniques;Dry needling;Energy conservation;Splinting;Taping;Vasopneumatic Device    PT  Next Visit Plan  reassess HEP    Consulted and Agree with Plan of Care  Patient       Patient will benefit from skilled therapeutic intervention in order to improve the following deficits and impairments:  Decreased activity tolerance, Decreased strength, Pain, Decreased balance, Increased muscle spasms, Improper body mechanics, Decreased range of motion, Impaired flexibility, Postural dysfunction  Visit Diagnosis: Acute midline low back pain without sciatica  Other symptoms and signs involving the musculoskeletal system  Muscle weakness (generalized)     Problem List Patient Active Problem List   Diagnosis Date Noted  . Lumbar foraminal stenosis 08/01/2018     Anette GuarneriYevgeniya Calyn Rubi, PT, DPT 10/27/18 6:03 PM   [facility] [address]  [address] [place], [place], [address] Phone: [phone]   Fax:  [phone]  Name: Juanda CrumbleDavid I Epp MRN: [id number] Date of Birth: April 02, 1966

## 2018-11-01 ENCOUNTER — Encounter: Payer: Self-pay | Admitting: Physical Therapy

## 2018-11-01 ENCOUNTER — Ambulatory Visit: Payer: BLUE CROSS/BLUE SHIELD | Admitting: Physical Therapy

## 2018-11-01 DIAGNOSIS — M545 Low back pain, unspecified: Secondary | ICD-10-CM

## 2018-11-01 DIAGNOSIS — R29898 Other symptoms and signs involving the musculoskeletal system: Secondary | ICD-10-CM | POA: Diagnosis not present

## 2018-11-01 DIAGNOSIS — M6281 Muscle weakness (generalized): Secondary | ICD-10-CM

## 2018-11-01 DIAGNOSIS — M25661 Stiffness of right knee, not elsewhere classified: Secondary | ICD-10-CM | POA: Diagnosis not present

## 2018-11-01 DIAGNOSIS — G8929 Other chronic pain: Secondary | ICD-10-CM | POA: Diagnosis not present

## 2018-11-01 DIAGNOSIS — M25561 Pain in right knee: Secondary | ICD-10-CM | POA: Diagnosis not present

## 2018-11-01 NOTE — Therapy (Signed)
[facility] [address]  [address] [place], [place], [address] Phone: [phone]   Fax:  [phone]  Physical Therapy Treatment  Patient Details  Name: Kyle Ford MRN: [id number] Date of Birth: Jun 27, 1966 Referring Provider (PT): Julio Sicks, MD   Encounter Date: 11/01/2018  PT End of Session - 11/01/18 1659    Visit Number  2    Number of Visits  17    Date for PT Re-Evaluation  12/22/18    Authorization Type  BCBS: VL 30    PT Start Time  1614    PT Stop Time  1658    PT Time Calculation (min)  44 min    Activity Tolerance  Patient tolerated treatment well    Behavior During Therapy  [facility] for tasks assessed/performed       Past Medical History:  Diagnosis Date  . Arthritis   . Bulging discs    lumbar  . 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 Dental bridge present    lower  . Dental crowns present   . Ischemic colitis (HCC)   . 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 HERNIA REPAIR    . INGUINAL HERNIA REPAIR Bilateral 1980s  . TENDON RECONSTRUCTION Right 07/06/2013   Procedure: REPAIR EXTENSOR CARPI DADIALIS BREVIS, EXTENSOR CARPI DADIALIS LONGUS, MICROFRACTURE CAPITELLUM;  Surgeon: Wyn Forster., MD;  Location: [facility];  Service: Orthopedics;  Laterality: Right;    There were no vitals filed for this visit.  Subjective Assessment - 11/01/18 1616    Subjective  Reports that exercises where he pivots his hips hurts the most and has avoided doing too many of those. Skipped walking this morning because he didn't sleep well.     Pertinent History  tendinopathy of R elbow, ischemic colitis, chondromalacia of elbow, R ECRB tendon reconstruction     Diagnostic tests  08/01/18 lumbar xray: L4-5 and L5-S1 PLIF without radiographic evidence for complication.    Patient Stated Goals  more flexibility    Currently in Pain?  No/denies                       Kyle Ford - 11/01/18 0001      Exercises   Exercises  Lumbar;Knee/Hip      Lumbar Exercises: Stretches   Lower Trunk Rotation Limitations  x20 to tolerance   cues to avoid pushing into pain     Lumbar Exercises: Aerobic   Nustep  L3 x UEs/LEs       Lumbar Exercises: Supine   Pelvic Tilt  10 reps    Pelvic Tilt Limitations  anterior/posterior tilts with demonstration    Bridge  10 reps    Bridge Limitations  cues for core contraction    Bridge with clamshell  10 reps   green TB around knees   Other Supine Lumbar Exercises  overhead yellow medball reach x15 with cues for posterior pelvic tilt   cues to slow down     Lumbar Exercises: Sidelying   Clam  Right;Left;15 reps    Clam Limitations  cues to avoid over-rotation into ER    Other Sidelying Lumbar Exercises  open book stretch 10x each side   cues to avoid rushing     Knee/Hip Exercises: Stretches  Passive Hamstring Stretch  Right;Left;2 reps;30 seconds    Passive Hamstring Stretch Limitations  supine with strap    Hip Flexor Stretch  Right;Left;2 reps;30 seconds    Hip Flexor Stretch Limitations  mod thomas with strap   cues to contract core to avoid pain   ITB Stretch  Right;Left;1 rep;30 seconds    ITB Stretch Limitations  supine with strap and added manual pressure    patient reporting not feeling stretch   Other Knee/Hip Stretches  R & L KTOS stretch 30" each LE   correction of line of pull            PT Education - 11/01/18 1659    Education Details  update to HEP    Person(s) Educated  Patient    Methods  Explanation;Demonstration;Tactile cues;Verbal cues;Handout    Comprehension  Verbalized understanding;Returned demonstration       PT Short Term Goals - 11/01/18 1704      PT SHORT TERM GOAL #1   Title  Patient to be independent with initial HEP.    Time  4     Period  Weeks    Status  On-going    Target Date  11/24/18        PT Long Term Goals - 11/01/18 1704      PT LONG TERM GOAL #1   Title  Patient to be independent with advanced HEP.    Time  8    Period  Weeks    Status  On-going      PT LONG TERM GOAL #2   Title  Patient to demonstrate 5/5 strength in B LEs.    Time  8    Period  Weeks    Status  On-going      PT LONG TERM GOAL #3   Title  Patient to demonstrate good body mechanics and no pain when lifting 20lb box from ground.    Time  8    Period  Weeks    Status  On-going      PT LONG TERM GOAL #4   Title  Patient to demonstrate mild tightness in B HS, piriformis, hip flexor/quad.     Time  8    Period  Weeks    Status  On-going      PT LONG TERM GOAL #5   Title  Patient to return to full work duties without pain.     Time  8    Period  Weeks    Status  On-going            Plan - 11/01/18 1659    Clinical Impression Statement  Patient arrived to session with report of some difficulty with HEP. Patient with difficulty with pelvic tilts, but showed improvement in form after demonstration. Reviewed LE stretching with cues for form and answering patient's questions about proper positioning. Patient reporting mild pain in LB with modified Thomas stretch, improved tolerance with cues to contract core. Tolerated bridges with banded resistance with good tolerance and form. Updated HEP with exercises that were well-tolerated today., Patient reported understanding. Ended session with no complaints.     Clinical Impairments Affecting Rehab Potential  tendinopathy of R elbow, ischemic colitis, chondromalacia of elbow, R ECRB tendon reconstruction     PT Treatment/Interventions  ADLs/Self Care Home Management;Cryotherapy;Electrical Stimulation;Functional mobility training;Stair training;Gait training;DME Instruction;Ultrasound;Moist Heat;Therapeutic activities;Therapeutic exercise;Balance training;Neuromuscular  re-education;Patient/family education;Orthotic Fit/Training;Passive range of motion;Scar mobilization;Manual techniques;Dry needling;Energy conservation;Splinting;Taping;Vasopneumatic Device    PT Next Visit Plan  progress LE strengthening, stretching, lumbopelvic ROM    Consulted and Agree with Plan of Care  Patient       Patient will benefit from skilled therapeutic intervention in order to improve the following deficits and impairments:  Decreased activity tolerance, Decreased strength, Pain, Decreased balance, Increased muscle spasms, Improper body mechanics, Decreased range of motion, Impaired flexibility, Postural dysfunction  Visit Diagnosis: Acute midline low back pain without sciatica  Other symptoms and signs involving the musculoskeletal system  Muscle weakness (generalized)     Problem List Patient Active Problem List   Diagnosis Date Noted  . Lumbar foraminal stenosis 08/01/2018     Anette GuarneriYevgeniya Grasiela Jonsson, PT, DPT 11/01/18 5:36 PM   [facility] [address]  [address] [place], [place], [address] Phone: [phone]   Fax:  [phone]  Name: Juanda CrumbleDavid I Robar MRN: [id number] Date of Birth: 01-29-1966

## 2018-11-02 ENCOUNTER — Ambulatory Visit: Payer: BLUE CROSS/BLUE SHIELD | Admitting: Physical Therapy

## 2018-11-02 ENCOUNTER — Encounter: Payer: Self-pay | Admitting: Family Medicine

## 2018-11-02 ENCOUNTER — Ambulatory Visit (HOSPITAL_BASED_OUTPATIENT_CLINIC_OR_DEPARTMENT_OTHER)
Admission: RE | Admit: 2018-11-02 | Discharge: 2018-11-02 | Disposition: A | Discharge status: Home / Self Care | Payer: BLUE CROSS/BLUE SHIELD | Source: Ambulatory Visit | Attending: Family Medicine | Admitting: Family Medicine

## 2018-11-02 ENCOUNTER — Ambulatory Visit (INDEPENDENT_AMBULATORY_CARE_PROVIDER_SITE_OTHER): Payer: BLUE CROSS/BLUE SHIELD | Admitting: Family Medicine

## 2018-11-02 ENCOUNTER — Encounter: Payer: Self-pay | Admitting: Physical Therapy

## 2018-11-02 VITALS — BP 129/80 | HR 92 | Ht 66.0 in | Wt 175.0 lb

## 2018-11-02 DIAGNOSIS — R29898 Other symptoms and signs involving the musculoskeletal system: Secondary | ICD-10-CM

## 2018-11-02 DIAGNOSIS — M6281 Muscle weakness (generalized): Secondary | ICD-10-CM

## 2018-11-02 DIAGNOSIS — M545 Low back pain, unspecified: Secondary | ICD-10-CM

## 2018-11-02 DIAGNOSIS — G8929 Other chronic pain: Secondary | ICD-10-CM | POA: Diagnosis not present

## 2018-11-02 DIAGNOSIS — M25561 Pain in right knee: Secondary | ICD-10-CM

## 2018-11-02 DIAGNOSIS — M1711 Unilateral primary osteoarthritis, right knee: Secondary | ICD-10-CM | POA: Diagnosis not present

## 2018-11-02 DIAGNOSIS — M25661 Stiffness of right knee, not elsewhere classified: Secondary | ICD-10-CM | POA: Diagnosis not present

## 2018-11-02 NOTE — Therapy (Signed)
[facility] Outpatient Rehabilitation [facility] [address]  [address] [place], [place], [address] Phone: [phone]   Fax:  [phone]  Physical Therapy Treatment  Patient Details  Name: Kyle Ford MRN: [id number] Date of Birth: December 22, 1965 Referring Provider (PT): Norton Blizzard, MD   Encounter Date: 11/02/2018  PT End of Session - 11/02/18 1754    Visit Number  3    Number of Visits  17    Date for PT Re-Evaluation  12/22/18    Authorization Type  BCBS: VL 30    PT Start Time  1615    PT Stop Time  1659    PT Time Calculation (min)  44 min    Activity Tolerance  Patient tolerated treatment well    Behavior During Therapy  [facility] for tasks assessed/performed       Past Medical History:  Diagnosis Date  . Arthritis   . Bulging discs    lumbar  . 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 Dental bridge present    lower  . Dental crowns present   . Ischemic colitis (HCC)   . 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 HERNIA REPAIR    . INGUINAL HERNIA REPAIR Bilateral 1980s  . TENDON RECONSTRUCTION Right 07/06/2013   Procedure: REPAIR EXTENSOR CARPI DADIALIS BREVIS, EXTENSOR CARPI DADIALIS LONGUS, MICROFRACTURE CAPITELLUM;  Surgeon: Wyn Forster., MD;  Location: [facility];  Service: Orthopedics;  Laterality: Right;    There were no vitals filed for this visit.  Subjective Assessment - 11/02/18 1618    Subjective  Patient reports R knee pain for a couple years without inciting event. Has noticed that R knee tends to lock and cause pain when he is in a squatted positing for a prolonged period of time and over time it has become more frequent. Reports that he can make the locking occur consistently when he squats and pivots. No pain with any other activities, no reports of knee buckling. When this occurs,  pain is 7/10 but 0/10 at baseline. Pain is located over anteromedial knee over joint line. Denies N/T.    Pertinent History  tendinopathy of R elbow, ischemic colitis, chondromalacia of elbow, R ECRB tendon reconstruction     Limitations  House hold activities   squatting, kneeling   How long can you sit comfortably?  unlimited    How long can you stand comfortably?  unlimited    How long can you walk comfortably?  unlimited    Diagnostic tests  08/01/18 lumbar xray: L4-5 and L5-S1 PLIF without radiographic evidence for complication.; 11/02/18 R knee xray:  Minimal degenerative changes are present in the knee    Patient Stated Goals  more flexibility    Currently in Pain?  Yes    Pain Score  2     Pain Location  Back    Pain Orientation  Lower    Pain Descriptors / Indicators  Tightness;Pressure    Pain Type  Surgical pain    Multiple Pain Sites  Yes    Pain Score  0    Pain Location  Knee    Pain Orientation  Right;Anterior;Medial    Pain Descriptors / Indicators  Stabbing    Pain Type  Chronic pain         [facility] PT Assessment - 11/02/18 1625  Assessment   Medical Diagnosis  Chronic pain of R knee    Referring Provider (PT)  Norton BlizzardShane Hudnall, MD    Onset Date/Surgical Date  11/02/16    Next MD Visit  12/26/18    Prior Therapy  Yes- for LBP      Precautions   Precautions  None   no squats, lunges, leg press     Restrictions   Weight Bearing Restrictions  No      ROM / Strength   AROM / PROM / Strength  AROM;PROM      AROM   AROM Assessment Site  Knee    Right/Left Knee  Right;Left    Right Knee Extension  -1    Right Knee Flexion  123    Left Knee Extension  -2    Left Knee Flexion  125      PROM   PROM Assessment Site  Knee    Right/Left Knee  Right;Left    Right Knee Extension  -2    Right Knee Flexion  129    Left Knee Extension  -3    Left Knee Flexion  130      Palpation   Patella mobility  B nonpainful and normal mobility in all directions     Palpation comment  TTP over R anterior jt line      Ambulation/Gait   Assistive device  None    Gait Pattern  Step-through pattern;Within Functional Limits    Ambulation Surface  Level;Indoor    Gait velocity  WFL                   [facility] Adult PT Treatment/Exercise - 11/02/18 0001      Knee/Hip Exercises: Standing   Terminal Knee Extension  Strengthening;Right;1 set;10 reps;Limitations    Theraband Level (Terminal Knee Extension)  Level 4 (Blue)    Terminal Knee Extension Limitations  10x3" with UE support on chair    Hip Abduction  Stengthening;Right;Left;1 set;10 reps;Knee straight    Abduction Limitations  at counter top    Hip Extension  Stengthening;Right;Left;1 set;10 reps;Knee straight    Extension Limitations  at counter top; cues to maintain trunk straight      Knee/Hip Exercises: Seated   Other Seated Knee/Hip Exercises  sitting R knee flexion stretch- discontonuied d/t patient not feeling stretch      Knee/Hip Exercises: Supine   Heel Slides  Strengthening;Right;1 set;10 reps    Heel Slides Limitations  on orange pball with strap   discontinued d/t patient not feeling stretch   Bridges with Har[employer]sonBall Squeeze  Strengthening;Both;2 sets;10 reps   c/o R HS cramping which resolved   Straight Leg Raises  Strengthening;Right;1 set;10 reps    Straight Leg Raises Limitations  cues to decrease speed    Straight Leg Raise with External Rotation  Strengthening;Right;1 set;10 reps    Straight Leg Raise with External Rotation Limitations  cues for quad set before each rep             PT Education - 11/02/18 1754    Education Details  update to HEP    Person(s) Educated  Patient    Methods  Explanation;Demonstration;Tactile cues;Verbal cues;Handout    Comprehension  Verbalized understanding;Returned demonstration       PT Short Term Goals - 11/02/18 1803      PT SHORT TERM GOAL #1   Title  Patient to be independent with initial HEP.    Time  3  Period   Weeks    Status  On-going    Target Date  11/24/18        PT Long Term Goals - 11/02/18 1804      PT LONG TERM GOAL #1   Title  Patient to be independent with advanced HEP.    Time  7    Period  Weeks    Status  On-going    Target Date  12/22/18      PT LONG TERM GOAL #2   Title  Patient to demonstrate 5/5 strength in B LEs.    Time  7    Period  Weeks    Status  On-going    Target Date  12/22/18      PT LONG TERM GOAL #3   Title  Patient to demonstrate good body mechanics and no pain when lifting 20lb box from ground.    Time  7    Period  Weeks    Status  On-going    Target Date  12/22/18      PT LONG TERM GOAL #4   Title  Patient to demonstrate mild tightness in B HS, piriformis, hip flexor/quad.     Time  7    Period  Weeks    Status  On-going    Target Date  12/22/18      PT LONG TERM GOAL #5   Title  Patient to return to full work duties without pain.     Time  7    Period  Weeks    Status  On-going    Target Date  12/22/18      Additional Long Term Goals   Additional Long Term Goals  Yes      PT LONG TERM GOAL #6   Title  Patient to demonstrate R knee AROM symmetrical to opposite LE.     Time  7    Period  Weeks    Status  New    Target Date  12/22/18      PT LONG TERM GOAL #7   Title  Patient to report 50% improvement in frequency of R knee locking with squatting activities.     Time  7    Period  Weeks    Status  New    Target Date  12/22/18            Plan - 11/02/18 1755    Clinical Impression Statement  Patient returned to PT with referral from MD for chronic R knee pain of a couple years duration. Patient reports that R knee tends to lock and cause pain when he is in a squatted positing for a prolonged period of time, worse if he pivots in this position. No pain with any other activities, no reports of knee buckling. MD advises to avoid squats, lunges, and leg press at this time. Patient today TTP over R anterior joint line, normal  patellar mobility, normal gait pattern, and slightly limited R knee flexion compared to opposite side. 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 Tolerated all strengthening activities well today, but reported that he did not feel much of a stretch with heel slides or sitting knee flexion stretch, thus these were discontinued. Did c/o slight B hip and LB tightness after performing standing hip abduction, but declined modalities. Educated on quad  strengthening HEP and reported understanding. Plan to address R knee pain in conjunction with LBP for remainder of patient's POC. Patient in agreement.     Clinical Impairments Affecting Rehab Potential  tendinopathy of R elbow, ischemic colitis, chondromalacia of elbow, R ECRB tendon reconstruction     PT Treatment/Interventions  ADLs/Self Care Home Management;Cryotherapy;Electrical Stimulation;Functional mobility training;Stair training;Gait training;DME Instruction;Ultrasound;Moist Heat;Therapeutic activities;Therapeutic exercise;Balance training;Neuromuscular re-education;Patient/family education;Orthotic Fit/Training;Passive range of motion;Scar mobilization;Manual techniques;Dry needling;Energy conservation;Splinting;Taping;Vasopneumatic Device;Iontophoresis 4mg /ml Dexamethasone    PT Next Visit Plan  progress LE strengthening, stretching, lumbopelvic ROM    Consulted and Agree with Plan of Care  Patient       Patient will benefit from skilled therapeutic intervention in order to improve the following deficits and impairments:  Decreased activity tolerance, Decreased strength, Pain, Decreased balance, Increased muscle spasms, Improper body mechanics, Decreased range of motion, Impaired flexibility, Postural dysfunction  Visit Diagnosis: Acute midline low back pain without sciatica  Other symptoms and signs  involving the musculoskeletal system  Muscle weakness (generalized)  Chronic pain of right knee  Stiffness of right knee, not elsewhere classified     Problem List Patient Active Problem List   Diagnosis Date Noted  . Lumbar foraminal stenosis 08/01/2018     Anette Guarneri, PT, DPT 11/02/18 6:10 PM   [facility] Outpatient Rehabilitation [facility] [address]  [address] [place], [place], [address] Phone: [phone]   Fax:  [phone]  Name: Kyle Ford MRN: [id number] Date of Birth: 01/01/1966

## 2018-11-02 NOTE — Patient Instructions (Addendum)
Your pain, catching are either due to arthritis or a medial meniscus tear of your knee. Get x-rays downstairs as you leave today - we will call you with the results. Start physical therapy next door for this as well. Tylenol, aleve only if needed. Exercises as tolerated - I would avoid deep squats, lunges, leg press if this bothers you until you fully rehab this. Follow up with me in 6 weeks. If not improving would consider MRI at that time.

## 2018-11-02 NOTE — Progress Notes (Signed)
PCP: Loyal Jacobson, MD  Subjective:   HPI: Patient is a 53 y.o. male here for right knee pain.  Patient reports for about 2 years he's had medial right knee pain with popping. Pain level 2/10 but up to 7/10 and sharp at times like if squats for a prolonged period then goes to get up. Not icing or taking any medicine for this. Feels like it gets stuck but able to kick leg out and this resolves. Did martial arts when younger but does not recall an acute injury. No skin changes, numbness, instability.  Past Medical History:  Diagnosis Date  . Arthritis   . Bulging discs    lumbar  . 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 Dental bridge present    lower  . Dental crowns present   . Ischemic colitis (HCC)   . 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 diazepam (VALIUM) 5 MG tablet Take 1-2 tablets (5-10 mg total) by mouth every 6 (six) hours as needed for muscle spasms. (Patient not taking: Reported on 10/27/2018) 40 tablet 0  . doxycycline (VIBRA-TABS) 100 MG tablet Take 1 tablet (100 mg total) by mouth 2 (two) times daily. (Patient not taking: Reported on 07/19/2018) 8 tablet 0  . oxyCODONE 10 MG TABS Take 0.5-1 tablets (5-10 mg total) by mouth every 3 (three) hours as needed for severe pain ((score 7 to 10)). (Patient not taking: Reported on 10/27/2018) 50 tablet 0   No current facility-administered medications on file prior to visit.     Past Surgical History:  Procedure Laterality Date  . HERNIA REPAIR    . INGUINAL HERNIA REPAIR Bilateral 1980s  . TENDON RECONSTRUCTION Right 07/06/2013   Procedure: REPAIR EXTENSOR CARPI DADIALIS BREVIS, EXTENSOR CARPI DADIALIS LONGUS, MICROFRACTURE CAPITELLUM;  Surgeon: Wyn Forster., MD;  Location: [facility];  Service: Orthopedics;  Laterality: Right;     Allergies  Allergen Reactions  . Penicillins Hives and Other (See Comments)    PATIENT HAS HAD A PCN REACTION WITH IMMEDIATE RASH, FACIAL/TONGUE/THROAT SWELLING, SOB, OR LIGHTHEADEDNESS WITH HYPOTENSION:  #  #  YES  #  #  HAS PT DEVELOPED SEVERE RASH INVOLVING MUCUS MEMBRANES or SKIN NECROSIS: #  #  YES  #  #  Has patient had a PCN reaction that required hospitalization: No Has patient had a PCN reaction occurring within the last 10 years: No   . Asa [Aspirin] Other (See Comments)    EPISTAXIS    Social History   Socioeconomic History  . Marital status: Married    Spouse name: Not on file  . Number of children: Not on file  . Years of education: Not on file  . Highest education level: Not on file  Occupational History  . Not on file  Social Needs  . Financial resource strain: Not on file  . Food insecurity:    Worry: Not on file    Inability: Not on file  . Transportation needs:    Medical: Not on file    Non-medical: Not on file  Tobacco Use  . Smoking status: Never Smoker  . Smokeless tobacco: Never Used  Substance and Sexual Activity  . Alcohol use: No  . Drug use: No  . Sexual activity: Not on file  Lifestyle  . Physical activity:  Days per week: Not on file    Minutes per session: Not on file  . Stress: Not on file  Relationships  . Social connections:    Talks on phone: Not on file    Gets together: Not on file    Attends religious service: Not on file    Active member of club or organization: Not on file    Attends meetings of clubs or organizations: Not on file    Relationship status: Not on file  . Intimate partner violence:    Fear of current or ex partner: Not on file    Emotionally abused: Not on file    Physically abused: Not on file    Forced sexual activity: Not on file  Other Topics Concern  . Not on file  Social History Narrative  . Not on file    History reviewed. No pertinent family history.  BP 129/80   Pulse 92   Ht 5\' 6"   (1.676 m)   Wt 175 lb (79.4 kg)   BMI 28.25 kg/m   Review of Systems: See HPI above.     Objective:  Physical Exam:  Gen: NAD, comfortable in exam room  Right knee: No gross deformity, ecchymoses, effusion. No TTP though pain typically anterior aspect of medial joint line.  No palpable plica. FROM with 5/5 strength - no patellar shift with flexion to extension. Negative ant/post drawers. Negative valgus/varus testing. Negative lachmans. Negative mcmurrays, apleys, patellar apprehension, thessalys, bounce. NV intact distally.  Left knee: No deformity. FROM with 5/5 strength. No tenderness to palpation. NVI distally.   Assessment & Plan:  1. Right knee pain - with catching.  Independently reviewed radiographs and minimal arthropathy noted and no other bony abnormalities.  Most likely due to medial meniscus tear despite exam.  He will start physical therapy and home exercise program.  Avoid squats, lunges, leg press.  Tylenol, aleve if needed.  F/u in 6 weeks - MRI if not improving.

## 2018-11-07 ENCOUNTER — Ambulatory Visit: Payer: BLUE CROSS/BLUE SHIELD

## 2018-11-07 DIAGNOSIS — M6281 Muscle weakness (generalized): Secondary | ICD-10-CM | POA: Diagnosis not present

## 2018-11-07 DIAGNOSIS — M545 Low back pain, unspecified: Secondary | ICD-10-CM

## 2018-11-07 DIAGNOSIS — M25661 Stiffness of right knee, not elsewhere classified: Secondary | ICD-10-CM

## 2018-11-07 DIAGNOSIS — R29898 Other symptoms and signs involving the musculoskeletal system: Secondary | ICD-10-CM

## 2018-11-07 DIAGNOSIS — G8929 Other chronic pain: Secondary | ICD-10-CM | POA: Diagnosis not present

## 2018-11-07 DIAGNOSIS — M25561 Pain in right knee: Secondary | ICD-10-CM | POA: Diagnosis not present

## 2018-11-07 NOTE — Therapy (Addendum)
[facility] Outpatient Rehabilitation [facility] [address]  [address] [place], [place], [address] Phone: [phone]   Fax:  [phone]  Physical Therapy Treatment  Patient Details  Name: Kyle Ford MRN: [id number] Date of Birth: 19-Dec-1965 Referring Provider (PT): Norton Blizzard, MD   Encounter Date: 11/07/2018  PT End of Session - 11/07/18 1711    Visit Number  4    Number of Visits  17    Date for PT Re-Evaluation  12/22/18    Authorization Type  BCBS: VL 30    PT Start Time  1700    PT Stop Time  1800    PT Time Calculation (min)  60 min    Activity Tolerance  Patient tolerated treatment well    Behavior During Therapy  [facility] for tasks assessed/performed       Past Medical History:  Diagnosis Date  . Arthritis   . Bulging discs    lumbar  . 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 Dental bridge present    lower  . Dental crowns present   . Ischemic colitis (HCC)   . 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 HERNIA REPAIR    . INGUINAL HERNIA REPAIR Bilateral 1980s  . TENDON RECONSTRUCTION Right 07/06/2013   Procedure: REPAIR EXTENSOR CARPI DADIALIS BREVIS, EXTENSOR CARPI DADIALIS LONGUS, MICROFRACTURE CAPITELLUM;  Surgeon: Wyn Forster., MD;  Location: [facility];  Service: Orthopedics;  Laterality: Right;    There were no vitals filed for this visit.  Subjective Assessment - 11/07/18 1709    Subjective  Pt. reporting pain with hip flexor HEP stretch.      Pertinent History  tendinopathy of R elbow, ischemic colitis, chondromalacia of elbow, R ECRB tendon reconstruction     Diagnostic tests  08/01/18 lumbar xray: L4-5 and L5-S1 PLIF without radiographic evidence for complication.; 11/02/18 R knee xray:  Minimal degenerative changes are present in the knee    Patient Stated Goals  more  flexibility    Currently in Pain?  Yes    Pain Score  2     Pain Location  Back    Pain Orientation  Lower    Pain Descriptors / Indicators  Tightness;Sore    Pain Type  Surgical pain                       [facility] Adult PT Treatment/Exercise - 11/07/18 0001      Self-Care   Self-Care  Other Self-Care Comments    Other Self-Care Comments   reviewed uses for TENS unit for pain relief from back pain;  Reviewed pt. exercise routine to check to make sure pt. not over exerting himself       Lumbar Exercises: Stretches   Passive Hamstring Stretch  Right;Left;1 rep;30 seconds    Passive Hamstring Stretch Limitations  strap supine    Piriformis Stretch  Right;Left;30 seconds    Piriformis Stretch Limitations  KTOS    Other Lumbar Stretch Exercise  B hip flexor stretch supine in mod thomas position and standing half kneeling on chair x 30 sec each way with each LE; pt. preferring standing with chair as this caused less back pain thus updated HEP with this       Lumbar Exercises: Aerobic   Nustep  L3 x UEs/LEs  Lumbar Exercises: Supine   Pelvic Tilt  10 reps    Pelvic Tilt Limitations  Minor cueing required to avoid painful end range       Lumbar Exercises: Sidelying   Clam  Right;Left;15 reps;3 seconds    Clam Limitations  red looped TB at knees per pt. request for increased resistance; pt. able to perform with good technique however cues required for slow pacing and hold times       Knee/Hip Exercises: Supine   Bridges with Beacher May  Strengthening;Both;2 sets;10 reps    Straight Leg Raises  Strengthening;Right;1 set;10 reps    Straight Leg Raises Limitations  Cues for quad set prior to each rep    Straight Leg Raise with External Rotation  Strengthening;Right;1 set;10 reps    Straight Leg Raise with External Rotation Limitations  cues for quad set prior to each rep      Modalities   Modalities  Electrical Stimulation;Moist Heat      Moist Heat Therapy    Number Minutes Moist Heat  10 Minutes    Moist Heat Location  Lumbar Spine      Electrical Stimulation   Electrical Stimulation Location  lumbar spine     Electrical Stimulation Action  IFC    Electrical Stimulation Parameters  to tolerance, 10    Electrical Stimulation Goals  Pain             PT Education - 11/07/18 1811    Education Details  HEP update; hip flexor alternative stretch using chair; TENS unit educational handout (as pt. already owns TENS unit)    Person(s) Educated  Patient    Methods  Explanation;Demonstration;Verbal cues;Handout    Comprehension  Verbalized understanding;Returned demonstration;Verbal cues required;Need further instruction       PT Short Term Goals - 11/02/18 1803      PT SHORT TERM GOAL #1   Title  Patient to be independent with initial HEP.    Time  3    Period  Weeks    Status  On-going    Target Date  11/24/18        PT Long Term Goals - 11/07/18 1735      PT LONG TERM GOAL #1   Title  Patient to be independent with advanced HEP.    Time  7    Period  Weeks    Status  On-going      PT LONG TERM GOAL #2   Title  Patient to demonstrate 5/5 strength in B LEs.    Time  7    Period  Weeks    Status  On-going      PT LONG TERM GOAL #3   Title  Patient to demonstrate good body mechanics and no pain when lifting 20lb box from ground.    Time  7    Period  Weeks    Status  On-going      PT LONG TERM GOAL #4   Title  Patient to demonstrate mild tightness in B HS, piriformis, hip flexor/quad.     Time  7    Period  Weeks    Status  On-going      PT LONG TERM GOAL #5   Title  Patient to return to full work duties without pain.     Time  7    Period  Weeks    Status  On-going      PT LONG TERM GOAL #6   Title  Patient  to demonstrate R knee AROM symmetrical to opposite LE.     Time  7    Period  Weeks    Status  On-going      PT LONG TERM GOAL #7   Title  Patient to report 50% improvement in frequency of R knee  locking with squatting activities.     Time  7    Period  Weeks    Status  On-going            Plan - 11/07/18 1712    Clinical Impression Statement  Pt. reporting some pain with supine hip flexor stretch since last session thus provided alternative positioning with improved tolerance for hip flexor stretch and updated HEP.  Required cueing with most therex activities today for slow pacing and proper hold times today and additional explanation required by pt. for rationale behind LE stretches, use of E-stim for pain relief (pt. has TENS unit at home), and need to avoid overexertion of exercise routine at home.  Pt. verbalized understanding.  Ended visit with some low-level LBP thus applied E-stim/moist heat to lumbar spine with good improvement following with pt. pain free leaving session.      Clinical Impairments Affecting Rehab Potential  tendinopathy of R elbow, ischemic colitis, chondromalacia of elbow, R ECRB tendon reconstruction     PT Treatment/Interventions  ADLs/Self Care Home Management;Cryotherapy;Electrical Stimulation;Functional mobility training;Stair training;Gait training;DME Instruction;Ultrasound;Moist Heat;Therapeutic activities;Therapeutic exercise;Balance training;Neuromuscular re-education;Patient/family education;Orthotic Fit/Training;Passive range of motion;Scar mobilization;Manual techniques;Dry needling;Energy conservation;Splinting;Taping;Vasopneumatic Device;Iontophoresis /ml Dexamethasone    PT Next Visit Plan  progress LE strengthening, stretching, lumbopelvic ROM    Consulted and Agree with Plan of Care  Patient       Patient will benefit from skilled therapeutic intervention in order to improve the following deficits and impairments:  Decreased activity tolerance, Decreased strength, Pain, Decreased balance, Increased muscle spasms, Improper body mechanics, Decreased range of motion, Impaired flexibility, Postural dysfunction  Visit Diagnosis: Acute  midline low back pain without sciatica  Other symptoms and signs involving the musculoskeletal system  Muscle weakness (generalized)  Chronic pain of right knee  Stiffness of right knee, not elsewhere classified     Problem List Patient Active Problem List   Diagnosis Date Noted  . Lumbar foraminal stenosis 08/01/2018    Kermit Balo, PTA 11/07/18 6:27 PM   [facility] Outpatient Rehabilitation [facility] [address]  [address] [place], [place], [address] Phone: [phone]   Fax:  [phone]  Name: Kyle Ford MRN: [id number] Date of Birth: 1966-02-11

## 2018-11-07 NOTE — Patient Instructions (Addendum)
TENS stands for Transcutaneous Electrical Nerve Stimulation. In other words, electrical impulses are allowed to pass through the skin in order to excite a nerve.   Purpose and Use of TENS:  TENS is a method used to manage acute and chronic pain without the use of drugs. It has been effective in managing pain associated with surgery, sprains, strains, trauma, rheumatoid arthritis, and neuralgias. It is a non-addictive, low risk, and non-invasive technique used to control pain. It is not, by any means, a curative form of treatment.   How TENS Works:  Most TENS units are a small pocket-sized unit powered by one 9 volt battery. Attached to the outside of the unit are two lead wires where two pins and/or snaps connect on each wire. All units come with a set of four reusable pads or electrodes. These are placed on the skin surrounding the area involved. By inserting the leads into  the pads, the electricity can pass from the unit making the circuit complete.  As the intensity is turned up slowly, the electrical current enters the body from the electrodes through the skin to the surrounding nerve fibers. This triggers the release of hormones from within the body. These hormones contain pain relievers. By increasing the circulation of these hormones, the person's pain may be lessened. It is also believed that the electrical stimulation itself helps to block the pain messages being sent to the brain, thus also decreasing the body's perception of pain.   Hazards:  TENS units are NOT to be used by patients with PACEMAKERS, DEFIBRILLATORS, DIABETIC PUMPS, PREGNANT WOMEN, and patients with SEIZURE DISORDERS.  TENS units are NOT to be used over the heart, throat, brain, or spinal cord.  One of the major side effects from the TENS unit may be skin irritation. Some people may develop a rash if they are sensitive to the materials used in the electrodes or the connecting wires.   Wear the unit for 15'.   Avoid overuse  due the body getting used to the stem making it not as effective over time.    

## 2018-11-10 ENCOUNTER — Encounter: Payer: Self-pay | Admitting: Physical Therapy

## 2018-11-10 ENCOUNTER — Ambulatory Visit: Payer: BLUE CROSS/BLUE SHIELD | Admitting: Physical Therapy

## 2018-11-10 DIAGNOSIS — M25561 Pain in right knee: Secondary | ICD-10-CM

## 2018-11-10 DIAGNOSIS — R29898 Other symptoms and signs involving the musculoskeletal system: Secondary | ICD-10-CM

## 2018-11-10 DIAGNOSIS — G8929 Other chronic pain: Secondary | ICD-10-CM | POA: Diagnosis not present

## 2018-11-10 DIAGNOSIS — M25661 Stiffness of right knee, not elsewhere classified: Secondary | ICD-10-CM | POA: Diagnosis not present

## 2018-11-10 DIAGNOSIS — M545 Low back pain, unspecified: Secondary | ICD-10-CM

## 2018-11-10 DIAGNOSIS — M6281 Muscle weakness (generalized): Secondary | ICD-10-CM

## 2018-11-10 NOTE — Therapy (Signed)
[facility] Outpatient Rehabilitation [facility] [address]  [address] [place], [place], [address] Phone: [phone]   Fax:  [phone]  Physical Therapy Treatment  Patient Details  Name: Kyle Ford MRN: [id number] Date of Birth: 22-Apr-1966 Referring Provider (PT): Norton Blizzard, MD   Encounter Date: 11/10/2018  PT End of Session - 11/10/18 1746    Visit Number  5    Number of Visits  17    Date for PT Re-Evaluation  12/22/18    Authorization Type  BCBS: VL 30    PT Start Time  1700    PT Stop Time  1758    PT Time Calculation (min)  58 min    Activity Tolerance  Patient tolerated treatment well;Patient limited by pain    Behavior During Therapy  [facility] for tasks assessed/performed       Past Medical History:  Diagnosis Date  . Arthritis   . Bulging discs    lumbar  . 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 Dental bridge present    lower  . Dental crowns present   . Ischemic colitis (HCC)   . 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 HERNIA REPAIR    . INGUINAL HERNIA REPAIR Bilateral 1980s  . TENDON RECONSTRUCTION Right 07/06/2013   Procedure: REPAIR EXTENSOR CARPI DADIALIS BREVIS, EXTENSOR CARPI DADIALIS LONGUS, MICROFRACTURE CAPITELLUM;  Surgeon: Wyn Forster., MD;  Location: [facility];  Service: Orthopedics;  Laterality: Right;    There were no vitals filed for this visit.  Subjective Assessment - 11/10/18 1702    Subjective  Reports that he has noticed more back pain- not sure if he is pushing himself too hard with the HEP. Did perform treadmill on 3 incline for an hour.     Pertinent History  tendinopathy of R elbow, ischemic colitis, chondromalacia of elbow, R ECRB tendon reconstruction     Diagnostic tests  08/01/18 lumbar xray: L4-5 and L5-S1 PLIF without radiographic evidence for  complication.; 11/02/18 R knee xray:  Minimal degenerative changes are present in the knee    Patient Stated Goals  more flexibility    Currently in Pain?  Yes    Pain Score  2     Pain Location  Back    Pain Orientation  Lower    Pain Descriptors / Indicators  Pressure    Pain Type  Surgical pain                       [facility] Adult PT Treatment/Exercise - 11/10/18 0001      Lumbar Exercises: Stretches   Single Knee to Chest Stretch  Right;Left;1 rep;30 seconds    Single Knee to Chest Stretch Limitations  to tolerance; c/o L LBP on L LE    Piriformis Stretch  Right;Left;30 seconds    Piriformis Stretch Limitations  KTOS      Lumbar Exercises: Aerobic   Nustep  L3 x UEs/LEs       Lumbar Exercises: Supine   Ab Set  5 reps    AB Set Limitations  5x10" TrA contraction   cues for palpation and proper contraction   Bridge with [employer]  10 reps    Bridge with [employer] Limitations  c/o pain in L LB even with cues for ab bracing  Other Supine Lumbar Exercises  TrA contraction + alt march x20   c/o 2/10 pain in LB with L hip flexion     Lumbar Exercises: Sidelying   Hip Abduction  Right;Left;10 reps    Hip Abduction Limitations  cues for alignment   c/o mild L LBP pain   Other Sidelying Lumbar Exercises  open book stretch 10x each side   goof form; audible pop in midback- nonpainfiul     Lumbar Exercises: Quadruped   Madcat/Old Horse  10 reps    Madcat/Old Horse Limitations  10x3" cues for rhythmic breathing   decreased ROM in lordosis/ant pelvic tilt     Knee/Hip Exercises: Standing   Terminal Knee Extension  Strengthening;Right;1 set;10 reps;Limitations    Theraband Level (Terminal Knee Extension)  Level 4 (Blue)    Terminal Knee Extension Limitations  10x3"       Moist Heat Therapy   Number Minutes Moist Heat  15 Minutes    Moist Heat Location  Lumbar Spine      Electrical Stimulation   Electrical Stimulation Location  lumbar spine      Electrical Stimulation Action  IFC    Electrical Stimulation Parameters  50-180hz ; output 19 to tolerance; 15 min    Electrical Stimulation Goals  Pain      Manual Therapy   Manual Therapy  Soft tissue mobilization    Soft tissue mobilization  STM to L lumbar paraspinals, superior glute- TTP over L lumbar parapsinals at ~L4 level             PT Education - 11/10/18 1746    Education Details  update to HEP; advised to discontinue bridge for 1 week to see if symptoms resolve    Person(s) Educated  Patient    Methods  Explanation;Demonstration;Tactile cues;Verbal cues;Handout    Comprehension  Verbalized understanding;Returned demonstration       PT Short Term Goals - 11/02/18 1803      PT SHORT TERM GOAL #1   Title  Patient to be independent with initial HEP.    Time  3    Period  Weeks    Status  On-going    Target Date  11/24/18        PT Long Term Goals - 11/07/18 1735      PT LONG TERM GOAL #1   Title  Patient to be independent with advanced HEP.    Time  7    Period  Weeks    Status  On-going      PT LONG TERM GOAL #2   Title  Patient to demonstrate 5/5 strength in B LEs.    Time  7    Period  Weeks    Status  On-going      PT LONG TERM GOAL #3   Title  Patient to demonstrate good body mechanics and no pain when lifting 20lb box from ground.    Time  7    Period  Weeks    Status  On-going      PT LONG TERM GOAL #4   Title  Patient to demonstrate mild tightness in B HS, piriformis, hip flexor/quad.     Time  7    Period  Weeks    Status  On-going      PT LONG TERM GOAL #5   Title  Patient to return to full work duties without pain.     Time  7    Period  Weeks  Status  On-going      PT LONG TERM GOAL #6   Title  Patient to demonstrate R knee AROM symmetrical to opposite LE.     Time  7    Period  Weeks    Status  On-going      PT LONG TERM GOAL #7   Title  Patient to report 50% improvement in frequency of R knee locking with squatting  activities.     Time  7    Period  Weeks    Status  On-going            Plan - 11/10/18 1747    Clinical Impression Statement  Patient arrived to session with report of increased pain in LB- notes that he is not sure if he has been pushing himself too far with his HEP. Introduced TrA contraction with cues for palpation and proper recruitment of muscles. Able to tolerate addition of marching with TrA contraction, however with mild pain in L LB. Patient reporting that this LBP has been ongoing since before his surgery, and is worse with L hip flexion. Patient also reporting this pain with bridges- advised patient to discontinue this exercise at home to see if symptoms resolve. Patient reported understanding. Introduced cat/cow exercise with patient demonstrating decreased lumbar lordosis. Ended session with moist heat and e-stim to LB for pain relief. Normal integumentary response observes and no complaints at end of session.     Clinical Impairments Affecting Rehab Potential  tendinopathy of R elbow, ischemic colitis, chondromalacia of elbow, R ECRB tendon reconstruction     PT Treatment/Interventions  ADLs/Self Care Home Management;Cryotherapy;Electrical Stimulation;Functional mobility training;Stair training;Gait training;DME Instruction;Ultrasound;Moist Heat;Therapeutic activities;Therapeutic exercise;Balance training;Neuromuscular re-education;Patient/family education;Orthotic Fit/Training;Passive range of motion;Scar mobilization;Manual techniques;Dry needling;Energy conservation;Splinting;Taping;Vasopneumatic Device;Iontophoresis 4mg /ml Dexamethasone    PT Next Visit Plan  progress LE strengthening, stretching, lumbopelvic ROM    Consulted and Agree with Plan of Care  Patient       Patient will benefit from skilled therapeutic intervention in order to improve the following deficits and impairments:  Decreased activity tolerance, Decreased strength, Pain, Decreased balance, Increased muscle  spasms, Improper body mechanics, Decreased range of motion, Impaired flexibility, Postural dysfunction  Visit Diagnosis: Acute midline low back pain without sciatica  Other symptoms and signs involving the musculoskeletal system  Muscle weakness (generalized)  Chronic pain of right knee  Stiffness of right knee, not elsewhere classified     Problem List Patient Active Problem List   Diagnosis Date Noted  . Lumbar foraminal stenosis 08/01/2018     Anette Guarneri, PT, DPT 11/10/18 6:03 PM   [facility] Outpatient Rehabilitation [facility] [address]  [address] [place], [place], [address] Phone: [phone]   Fax:  [phone]  Name: Kyle Ford MRN: [id number] Date of Birth: 06-14-1966

## 2018-11-14 ENCOUNTER — Ambulatory Visit: Payer: BLUE CROSS/BLUE SHIELD | Attending: Neurosurgery | Admitting: Physical Therapy

## 2018-11-14 ENCOUNTER — Encounter: Payer: Self-pay | Admitting: Physical Therapy

## 2018-11-14 DIAGNOSIS — M6281 Muscle weakness (generalized): Secondary | ICD-10-CM | POA: Diagnosis not present

## 2018-11-14 DIAGNOSIS — M25661 Stiffness of right knee, not elsewhere classified: Secondary | ICD-10-CM | POA: Diagnosis not present

## 2018-11-14 DIAGNOSIS — G8929 Other chronic pain: Secondary | ICD-10-CM | POA: Insufficient documentation

## 2018-11-14 DIAGNOSIS — R29898 Other symptoms and signs involving the musculoskeletal system: Secondary | ICD-10-CM | POA: Diagnosis not present

## 2018-11-14 DIAGNOSIS — M25561 Pain in right knee: Secondary | ICD-10-CM | POA: Diagnosis not present

## 2018-11-14 DIAGNOSIS — M545 Low back pain, unspecified: Secondary | ICD-10-CM

## 2018-11-14 NOTE — Therapy (Signed)
[facility] Outpatient Rehabilitation [facility] [address]  [address] [place], [place], [address] Phone: [phone]   Fax:  [phone]  Physical Therapy Treatment  Patient Details  Name: Kyle Ford MRN: [id number] Date of Birth: 02/14/1966 Referring Provider (PT): Norton Blizzard, MD   Encounter Date: 11/14/2018  PT End of Session - 11/14/18 1614    Visit Number  6    Number of Visits  17    Date for PT Re-Evaluation  12/22/18    Authorization Type  BCBS: VL 30    PT Start Time  1530    PT Stop Time  1614    PT Time Calculation (min)  44 min    Activity Tolerance  Patient tolerated treatment well;Patient limited by pain    Behavior During Therapy  [facility] for tasks assessed/performed       Past Medical History:  Diagnosis Date  . Arthritis   . Bulging discs    lumbar  . 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 Dental bridge present    lower  . Dental crowns present   . Ischemic colitis (HCC)   . 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 HERNIA REPAIR    . INGUINAL HERNIA REPAIR Bilateral 1980s  . TENDON RECONSTRUCTION Right 07/06/2013   Procedure: REPAIR EXTENSOR CARPI DADIALIS BREVIS, EXTENSOR CARPI DADIALIS LONGUS, MICROFRACTURE CAPITELLUM;  Surgeon: Wyn Forster., MD;  [facility];  Service: Orthopedics;  Laterality: Right;    There were no vitals filed for this visit.  Subjective Assessment - 11/14/18 1532    Subjective  Patient reports that he has held off on performing treadmill, elliptical, or bridges since last session and feels that his pain along B LB is worse than the last session.     Pertinent History  tendinopathy of R elbow, ischemic colitis, chondromalacia of elbow, R ECRB tendon reconstruction     Diagnostic tests  08/01/18 lumbar xray: L4-5 and L5-S1 PLIF without  radiographic evidence for complication.; 11/02/18 R knee xray:  Minimal degenerative changes are present in the knee    Patient Stated Goals  more flexibility    Currently in Pain?  Yes    Pain Score  3     Pain Location  Back    Pain Orientation  Left;Lower    Pain Descriptors / Indicators  Dull    Pain Type  Surgical pain                       [facility] Adult PT Treatment/Exercise - 11/14/18 0001      Lumbar Exercises: Stretches   Passive Hamstring Stretch  Right;Left;1 rep;30 seconds    Passive Hamstring Stretch Limitations  strap supine    Hip Flexor Stretch  Right;Left;1 rep;30 seconds    Hip Flexor Stretch Limitations  mod thomas stretch with strap   cues to tighten core     Lumbar Exercises: Aerobic   Nustep  L4 x UEs/LEs       Lumbar Exercises: Seated   Long Arc Quad on [place]  AROM;Both;1 set;20 reps    LAQ on Ball Limitations  on green pball alternating   cues to contract core   Hip Flexion on Ball Limitations  Sitting paloff press on green ball x10 each side with green TB  cues for scap retraction and core contraction   Sit to Stand Limitations  sitting on green pball rows with green TB x15    Other Seated Lumbar Exercises  sitting on green pball pelvic tilts 15x each direction    Other Seated Lumbar Exercises  sitting forward flexion stretch with green pball 15x3"   c/o discomfort in L LB with extension     Knee/Hip Exercises: Standing   Hip Abduction  Stengthening;Right;Left;1 set;Knee straight;15 reps    Abduction Limitations  at TM rail    Other Standing Knee Exercises  sidestepping with red TB around ankles 2x72ft   cues to contract core     Moist Heat Therapy   Number Minutes Moist Heat  15 Minutes    Moist Heat Location  Lumbar Spine      Electrical Stimulation   Electrical Stimulation Location  lumbar spine     Electrical Stimulation Action  IFC    Electrical Stimulation Parameters  50-180 hz; output 21 to tol; 15 min    Electrical  Stimulation Goals  Pain               PT Short Term Goals - 11/02/18 1803      PT SHORT TERM GOAL #1   Title  Patient to be independent with initial HEP.    Time  3    Period  Weeks    Status  On-going    Target Date  11/24/18        PT Long Term Goals - 11/07/18 1735      PT LONG TERM GOAL #1   Title  Patient to be independent with advanced HEP.    Time  7    Period  Weeks    Status  On-going      PT LONG TERM GOAL #2   Title  Patient to demonstrate 5/5 strength in B LEs.    Time  7    Period  Weeks    Status  On-going      PT LONG TERM GOAL #3   Title  Patient to demonstrate good body mechanics and no pain when lifting 20lb box from ground.    Time  7    Period  Weeks    Status  On-going      PT LONG TERM GOAL #4   Title  Patient to demonstrate mild tightness in B HS, piriformis, hip flexor/quad.     Time  7    Period  Weeks    Status  On-going      PT LONG TERM GOAL #5   Title  Patient to return to full work duties without pain.     Time  7    Period  Weeks    Status  On-going      PT LONG TERM GOAL #6   Title  Patient to demonstrate R knee AROM symmetrical to opposite LE.     Time  7    Period  Weeks    Status  On-going      PT LONG TERM GOAL #7   Title  Patient to report 50% improvement in frequency of R knee locking with squatting activities.     Time  7    Period  Weeks    Status  On-going            Plan - 11/14/18 1615    Clinical Impression Statement  Patient arrived to session with report of increased L LB since  last session, despite avoiding treadmill, elliptical, and bridges. Dis mention that he washed his car over the weekend. Reports that lumbar extension and anterior pelvic tilting aggravates his pain, and that he experienced this pain at his last MD appointment, with MD advising him that it with normal. Worked on progressive core strengthening exercises this session sitting on physioball. Patient requiring cues for scapular  retraction and upright posture. Patient unable to tolerate 3 way hip at treadmill rail, reporting that R hip flexion and L hip extension aggravates the L LB. Pain 3/10 at end of session, thus patient received e-stim and moist heat to LB for pain relief. Normal integumentary response and report of relief at end of session.     Clinical Impairments Affecting Rehab Potential  tendinopathy of R elbow, ischemic colitis, chondromalacia of elbow, R ECRB tendon reconstruction     PT Treatment/Interventions  ADLs/Self Care Home Management;Cryotherapy;Electrical Stimulation;Functional mobility training;Stair training;Gait training;DME Instruction;Ultrasound;Moist Heat;Therapeutic activities;Therapeutic exercise;Balance training;Neuromuscular re-education;Patient/family education;Orthotic Fit/Training;Passive range of motion;Scar mobilization;Manual techniques;Dry needling;Energy conservation;Splinting;Taping;Vasopneumatic Device;Iontophoresis 4mg /ml Dexamethasone    PT Next Visit Plan  assess response to lumbopelvic ROM; progress LE strengthening, stretching, lumbopelvic ROM    Consulted and Agree with Plan of Care  Patient       Patient will benefit from skilled therapeutic intervention in order to improve the following deficits and impairments:  Decreased activity tolerance, Decreased strength, Pain, Decreased balance, Increased muscle spasms, Improper body mechanics, Decreased range of motion, Impaired flexibility, Postural dysfunction  Visit Diagnosis: Acute midline low back pain without sciatica  Other symptoms and signs involving the musculoskeletal system  Muscle weakness (generalized)  Chronic pain of right knee  Stiffness of right knee, not elsewhere classified     Problem List Patient Active Problem List   Diagnosis Date Noted  . Lumbar foraminal stenosis 08/01/2018    Anette GuarneriYevgeniya Kash [place], PT, DPT 11/14/18 4:39 PM   [facility] [place] [address]  [address] [place], [place], [address] Phone: [phone]   Fax:  [phone]  Name: Kyle Ford MRN: [id number] Date of Birth: 11-06-1965

## 2018-11-17 ENCOUNTER — Ambulatory Visit: Payer: BLUE CROSS/BLUE SHIELD

## 2018-11-17 DIAGNOSIS — G8929 Other chronic pain: Secondary | ICD-10-CM | POA: Diagnosis not present

## 2018-11-17 DIAGNOSIS — M25561 Pain in right knee: Secondary | ICD-10-CM | POA: Diagnosis not present

## 2018-11-17 DIAGNOSIS — M545 Low back pain, unspecified: Secondary | ICD-10-CM

## 2018-11-17 DIAGNOSIS — M6281 Muscle weakness (generalized): Secondary | ICD-10-CM

## 2018-11-17 DIAGNOSIS — M25661 Stiffness of right knee, not elsewhere classified: Secondary | ICD-10-CM | POA: Diagnosis not present

## 2018-11-17 DIAGNOSIS — R29898 Other symptoms and signs involving the musculoskeletal system: Secondary | ICD-10-CM

## 2018-11-17 NOTE — Therapy (Signed)
[facility] Outpatient Rehabilitation [facility] [address]  [address] [place], [place], [address] Phone: [phone]   Fax:  [phone]  Physical Therapy Treatment  Patient Details  Name: Kyle Ford MRN: [id number] Date of Birth: 1966-04-13 Referring Provider (PT): Norton Blizzard, MD   Encounter Date: 11/17/2018  PT End of Session - 11/17/18 1533    Visit Number  7    Number of Visits  17    Date for PT Re-Evaluation  12/22/18    Authorization Type  BCBS: VL 30    PT Start Time  1530    PT Stop Time  1631    PT Time Calculation (min)  61 min    Activity Tolerance  Patient tolerated treatment well;Patient limited by pain    Behavior During Therapy  [facility] for tasks assessed/performed       Past Medical History:  Diagnosis Date  . Arthritis   . Bulging discs    lumbar  . 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 Dental bridge present    lower  . Dental crowns present   . Ischemic colitis (HCC)   . 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 HERNIA REPAIR    . INGUINAL HERNIA REPAIR Bilateral 1980s  . TENDON RECONSTRUCTION Right 07/06/2013   Procedure: REPAIR EXTENSOR CARPI DADIALIS BREVIS, EXTENSOR CARPI DADIALIS LONGUS, MICROFRACTURE CAPITELLUM;  Surgeon: Wyn Forster., MD;  Location: [facility];  Service: Orthopedics;  Laterality: Right;    There were no vitals filed for this visit.  Subjective Assessment - 11/17/18 1532    Subjective  Reports he has felt less pain since last session.      Pertinent History  tendinopathy of R elbow, ischemic colitis, chondromalacia of elbow, R ECRB tendon reconstruction     Diagnostic tests  08/01/18 lumbar xray: L4-5 and L5-S1 PLIF without radiographic evidence for complication.; 11/02/18 R knee xray:  Minimal degenerative changes are present in the knee    Patient  Stated Goals  more flexibility    Currently in Pain?  Yes    Pain Score  1    Up to 4/10 pain on Monday   Pain Location  Back    Pain Orientation  Left;Lower    Pain Descriptors / Indicators  Dull    Pain Type  Surgical pain    Multiple Pain Sites  No                       [facility] Adult PT Treatment/Exercise - 11/17/18 1539      Lumbar Exercises: Stretches   Quad Stretch  Right;Left;1 rep;30 seconds    Quad Stretch Limitations  prone with strap     Piriformis Stretch  Right;Left;30 seconds    Piriformis Stretch Limitations  KTOS      Lumbar Exercises: Aerobic   Recumbent Bike  Lvl 2, 6 min       Lumbar Exercises: Machines for Strengthening   Cybex Knee Flexion  B LE's 25# x 10 reps; B con/R ecc 20# x 15 reps       Lumbar Exercises: Supine   Dead Bug  15 reps;3 seconds      Lumbar Exercises: Quadruped   Opposite Arm/Leg Raise  Right arm/Left leg;Left arm/Right leg;10 reps      Knee/Hip Exercises: Standing  Hip ADduction  Left;Right;15 reps;Strengthening    Hip ADduction Limitations  yellow TB at ankle; 2 ski poles     Hip Abduction  Right;Left;15 reps;Knee straight;Stengthening    Abduction Limitations  yellow TB at ankle; 2 ski poles     Step Down  Right;10 reps;1 set;Step Height: 4"    Step Down Limitations  eccentric heel tap      Knee/Hip Exercises: Supine   Straight Leg Raises  Right;10 reps;Strengthening;1 set    Straight Leg Raises Limitations  2#    Straight Leg Raise with External Rotation  Right;10 reps    Straight Leg Raise with External Rotation Limitations  2#      Moist Heat Therapy   Number Minutes Moist Heat  15 Minutes    Moist Heat Location  Lumbar Spine      Electrical Stimulation   Electrical Stimulation Location  lumbar spine     Electrical Stimulation Action  IFC    Electrical Stimulation Parameters  to tolerance, 15'    Electrical Stimulation Goals  Pain               PT Short Term Goals - 11/17/18 1546      PT  SHORT TERM GOAL #1   Title  Patient to be independent with initial HEP.    Time  3    Period  Weeks    Status  Achieved    Target Date  11/24/18        PT Long Term Goals - 11/07/18 1735      PT LONG TERM GOAL #1   Title  Patient to be independent with advanced HEP.    Time  7    Period  Weeks    Status  On-going      PT LONG TERM GOAL #2   Title  Patient to demonstrate 5/5 strength in B LEs.    Time  7    Period  Weeks    Status  On-going      PT LONG TERM GOAL #3   Title  Patient to demonstrate good body mechanics and no pain when lifting 20lb box from ground.    Time  7    Period  Weeks    Status  On-going      PT LONG TERM GOAL #4   Title  Patient to demonstrate mild tightness in B HS, piriformis, hip flexor/quad.     Time  7    Period  Weeks    Status  On-going      PT LONG TERM GOAL #5   Title  Patient to return to full work duties without pain.     Time  7    Period  Weeks    Status  On-going      PT LONG TERM GOAL #6   Title  Patient to demonstrate R knee AROM symmetrical to opposite LE.     Time  7    Period  Weeks    Status  On-going      PT LONG TERM GOAL #7   Title  Patient to report 50% improvement in frequency of R knee locking with squatting activities.     Time  7    Period  Weeks    Status  On-going            Plan - 11/17/18 1536    Clinical Impression Statement  Kyle Ford reporting he feels his pain levels are unchanged since last session.  Still with complaint of back pain frequently with daily activities.  Feels his back pain rises with HEP performance however unable to identify an exercise in home program that irritates his back.  Tolerated progression of lumbopelvic strengthening well today.  Frequent cueing provided to pt. with standing SLS activities for abdom. bracing with pt. able to perform with only mild increased pain.  Ended visit with E-stim/moist heat applied to lumbar spine to reduce post-exercise soreness.  Pt. reporting he  is still getting "locking" pain at R knee in deep squatting positions however does report he limits himself with these activities.      Rehab Potential  Good    Clinical Impairments Affecting Rehab Potential  tendinopathy of R elbow, ischemic colitis, chondromalacia of elbow, R ECRB tendon reconstruction     PT Treatment/Interventions  ADLs/Self Care Home Management;Cryotherapy;Electrical Stimulation;Functional mobility training;Stair training;Gait training;DME Instruction;Ultrasound;Moist Heat;Therapeutic activities;Therapeutic exercise;Balance training;Neuromuscular re-education;Patient/family education;Orthotic Fit/Training;Passive range of motion;Scar mobilization;Manual techniques;Dry needling;Energy conservation;Splinting;Taping;Vasopneumatic Device;Iontophoresis /ml Dexamethasone    PT Next Visit Plan  Progress LE strengthening, stretching, lumbopelvic ROM    Consulted and Agree with Plan of Care  Patient       Patient will benefit from skilled therapeutic intervention in order to improve the following deficits and impairments:  Decreased activity tolerance, Decreased strength, Pain, Decreased balance, Increased muscle spasms, Improper body mechanics, Decreased range of motion, Impaired flexibility, Postural dysfunction  Visit Diagnosis: Acute midline low back pain without sciatica  Other symptoms and signs involving the musculoskeletal system  Muscle weakness (generalized)  Chronic pain of right knee  Stiffness of right knee, not elsewhere classified     Problem List Patient Active Problem List   Diagnosis Date Noted  . Lumbar foraminal stenosis 08/01/2018    Kermit Balo, PTA 11/17/18 6:35 PM   [facility] [address]  [address] [place], [place], [address] Phone: [phone]   Fax:  [phone]  Name: Kyle Ford MRN: [id number] Date of Birth: 1965-09-23

## 2018-11-21 ENCOUNTER — Ambulatory Visit: Payer: BLUE CROSS/BLUE SHIELD

## 2018-11-21 DIAGNOSIS — G8929 Other chronic pain: Secondary | ICD-10-CM

## 2018-11-21 DIAGNOSIS — M545 Low back pain, unspecified: Secondary | ICD-10-CM

## 2018-11-21 DIAGNOSIS — M6281 Muscle weakness (generalized): Secondary | ICD-10-CM | POA: Diagnosis not present

## 2018-11-21 DIAGNOSIS — R29898 Other symptoms and signs involving the musculoskeletal system: Secondary | ICD-10-CM | POA: Diagnosis not present

## 2018-11-21 DIAGNOSIS — M25561 Pain in right knee: Secondary | ICD-10-CM | POA: Diagnosis not present

## 2018-11-21 DIAGNOSIS — M25661 Stiffness of right knee, not elsewhere classified: Secondary | ICD-10-CM

## 2018-11-21 NOTE — Therapy (Signed)
[facility] Outpatient Rehabilitation [facility] [address]  [address] [place], [place], [address] Phone: [phone]   Fax:  [phone]  Physical Therapy Treatment  Patient Details  Name: Kyle Ford MRN: [id number] Date of Birth: [birth year] Referring Provider (PT): Norton Blizzard, MD   Encounter Date: 11/21/2018  PT End of Session - 11/21/18 1537    Visit Number  8    Number of Visits  17    Date for PT Re-Evaluation  12/22/18    Authorization Type  BCBS: VL 30    PT Start Time  1535    PT Stop Time  1630    PT Time Calculation (min)  55 min    Activity Tolerance  Patient tolerated treatment well;Patient limited by pain    Behavior During Therapy  [facility] for tasks assessed/performed       Past Medical History:  Diagnosis Date  . Arthritis   . Bulging discs    lumbar  . 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 Dental bridge present    lower  . Dental crowns present   . Ischemic colitis (HCC)   . 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 HERNIA REPAIR    . INGUINAL HERNIA REPAIR Bilateral 1980s  . TENDON RECONSTRUCTION Right 07/06/2013   Procedure: REPAIR EXTENSOR CARPI DADIALIS BREVIS, EXTENSOR CARPI DADIALIS LONGUS, MICROFRACTURE CAPITELLUM;  Surgeon: Wyn Forster., MD;  Location: [facility];  Service: Orthopedics;  Laterality: Right;    There were no vitals filed for this visit.  Subjective Assessment - 11/21/18 1539    Subjective  Pt. reporting back pain worse today however rode in car to [facility] since last session and attributes this to long car ride.      Pertinent History  tendinopathy of R elbow, ischemic colitis, chondromalacia of elbow, R ECRB tendon reconstruction     Diagnostic tests  08/01/18 lumbar xray: L4-5 and L5-S1 PLIF without radiographic evidence for complication.; 11/02/18 R knee xray:   Minimal degenerative changes are present in the knee    Patient Stated Goals  more flexibility    Currently in Pain?  Yes    Pain Score  4     Pain Location  Back    Pain Orientation  Left;Lower    Pain Descriptors / Indicators  Dull;Sharp   sharp at times    Pain Radiating Towards  Pain down L Lateral/posterior LE into calf     Multiple Pain Sites  No    Pain Score  0   Pain rising to "locking" pain at 7/10 with deep squatting   Pain Location  Knee    Pain Orientation  Right;Anterior    Pain Descriptors / Indicators  Stabbing                       [facility] Adult PT Treatment/Exercise - 11/21/18 1601      Lumbar Exercises: Stretches   Single Knee to Chest Stretch  Right;Left;2 reps;30 seconds    Lower Trunk Rotation  10 seconds;5 reps    Lower Trunk Rotation Limitations  to tolerance     Piriformis Stretch  Right;Left;2 reps;30 seconds    Piriformis Stretch Limitations  KTOS    Other Lumbar Stretch Exercise  B seated piri KTOS stretch     Other Lumbar Stretch  Exercise  Standing hip hinge to tolerance x 2 reps; discontinued due to increased LBP      Lumbar Exercises: Aerobic   Recumbent Bike  Lvl 2, 6 min       Lumbar Exercises: Seated   Other Seated Lumbar Exercises  Seated L sciatic nerve glides x 10 reps       Lumbar Exercises: Supine   Bridge with Ball Squeeze  3 seconds   x 1 reps then terminated due to increased LBP     Lumbar Exercises: Prone   Other Prone Lumbar Exercises  Prone x 30 sec; increased LBP      Moist Heat Therapy   Number Minutes Moist Heat  15 Minutes    Moist Heat Location  Lumbar Spine      Electrical Stimulation   Electrical Stimulation Location  lumbar spine     Electrical Stimulation Action  IFC    Electrical Stimulation Parameters  to tolerance, 15'    Electrical Stimulation Goals  Pain      Manual Therapy   Manual Therapy  Soft tissue mobilization    Manual therapy comments  seated     Soft tissue mobilization  STM to B  lumbar paraspinals ttp at L PSIS               PT Short Term Goals - 11/17/18 1546      PT SHORT TERM GOAL #1   Title  Patient to be independent with initial HEP.    Time  3    Period  Weeks    Status  Achieved    Target Date  11/24/18        PT Long Term Goals - 11/07/18 1735      PT LONG TERM GOAL #1   Title  Patient to be independent with advanced HEP.    Time  7    Period  Weeks    Status  On-going      PT LONG TERM GOAL #2   Title  Patient to demonstrate 5/5 strength in B LEs.    Time  7    Period  Weeks    Status  On-going      PT LONG TERM GOAL #3   Title  Patient to demonstrate good body mechanics and no pain when lifting 20lb box from ground.    Time  7    Period  Weeks    Status  On-going      PT LONG TERM GOAL #4   Title  Patient to demonstrate mild tightness in B HS, piriformis, hip flexor/quad.     Time  7    Period  Weeks    Status  On-going      PT LONG TERM GOAL #5   Title  Patient to return to full work duties without pain.     Time  7    Period  Weeks    Status  On-going      PT LONG TERM GOAL #6   Title  Patient to demonstrate R knee AROM symmetrical to opposite LE.     Time  7    Period  Weeks    Status  On-going      PT LONG TERM GOAL #7   Title  Patient to report 50% improvement in frequency of R knee locking with squatting activities.     Time  7    Period  Weeks    Status  On-going  Plan - 11/21/18 1540    Clinical Impression Statement  Pt. reporting increased L-LBP and onset of L posterior LE pain down into calf muscle over weekend.  Pt. noting he drove to [facility] ~ 8-hr car ride with one break taken at 4 hour mark.  Notes trip was stressful due to poor weather [place].  Attributes increased LBP and onset of L LE radicular symptoms last few days to long trip to [facility].   Pt. irritable to extension and flexion positioning today with increased L-sided LBP with most therex activities with exception  of hooklying LE stretching, and gentle lumbopelvic strengthening.  Ended visit with E-stim/moist heat to lumbar spine with pt. noting complete reduction in LBP and no radicular symptoms.  Will continue to progress toward goals per pt. in coming session.      Rehab Potential  Good    Clinical Impairments Affecting Rehab Potential  tendinopathy of R elbow, ischemic colitis, chondromalacia of elbow, R ECRB tendon reconstruction     PT Treatment/Interventions  ADLs/Self Care Home Management;Cryotherapy;Electrical Stimulation;Functional mobility training;Stair training;Gait training;DME Instruction;Ultrasound;Moist Heat;Therapeutic activities;Therapeutic exercise;Balance training;Neuromuscular re-education;Patient/family education;Orthotic Fit/Training;Passive range of motion;Scar mobilization;Manual techniques;Dry needling;Energy conservation;Splinting;Taping;Vasopneumatic Device;Iontophoresis /ml Dexamethasone    PT Next Visit Plan  Progress LE strengthening, stretching, lumbopelvic ROM    Consulted and Agree with Plan of Care  Patient       Patient will benefit from skilled therapeutic intervention in order to improve the following deficits and impairments:  Decreased activity tolerance, Decreased strength, Pain, Decreased balance, Increased muscle spasms, Improper body mechanics, Decreased range of motion, Impaired flexibility, Postural dysfunction  Visit Diagnosis: Acute midline low back pain without sciatica  Other symptoms and signs involving the musculoskeletal system  Muscle weakness (generalized)  Chronic pain of right knee  Stiffness of right knee, not elsewhere classified     Problem List Patient Active Problem List   Diagnosis Date Noted  . Lumbar foraminal stenosis 08/01/2018    Kermit Balo, PTA 11/21/18 6:53 PM   [facility] Outpatient Rehabilitation [facility] [address]  [address] [place], [place], [address] Phone: [phone]   Fax:   [phone]  Name: Kyle Ford MRN: [id number] Date of Birth: 1966/04/30

## 2018-11-24 ENCOUNTER — Other Ambulatory Visit: Payer: Self-pay

## 2018-11-24 ENCOUNTER — Ambulatory Visit: Payer: BLUE CROSS/BLUE SHIELD

## 2018-11-24 DIAGNOSIS — M25661 Stiffness of right knee, not elsewhere classified: Secondary | ICD-10-CM

## 2018-11-24 DIAGNOSIS — M545 Low back pain, unspecified: Secondary | ICD-10-CM

## 2018-11-24 DIAGNOSIS — Z6828 Body mass index (BMI) 28.0-28.9, adult: Secondary | ICD-10-CM | POA: Diagnosis not present

## 2018-11-24 DIAGNOSIS — M6281 Muscle weakness (generalized): Secondary | ICD-10-CM | POA: Diagnosis not present

## 2018-11-24 DIAGNOSIS — R29898 Other symptoms and signs involving the musculoskeletal system: Secondary | ICD-10-CM | POA: Diagnosis not present

## 2018-11-24 DIAGNOSIS — G8929 Other chronic pain: Secondary | ICD-10-CM

## 2018-11-24 DIAGNOSIS — M25561 Pain in right knee: Secondary | ICD-10-CM | POA: Diagnosis not present

## 2018-11-24 NOTE — Therapy (Signed)
[facility] Outpatient Rehabilitation [facility] [address]  [address] [place], [place], [address] Phone: [phone]   Fax:  [phone]  Physical Therapy Treatment  Patient Details  Name: Kyle Ford MRN: [id number] Date of Birth: 01-08-1966 Referring Provider (PT): Norton Blizzard, MD   Encounter Date: 11/24/2018  PT End of Session - 11/24/18 1535    Visit Number  9    Number of Visits  17    Date for PT Re-Evaluation  12/22/18    Authorization Type  BCBS: VL 30    PT Start Time  1530    PT Stop Time  1630    PT Time Calculation (min)  60 min    Activity Tolerance  Patient tolerated treatment well;Patient limited by pain    Behavior During Therapy  [facility] for tasks assessed/performed       Past Medical History:  Diagnosis Date  . Arthritis   . Bulging discs    lumbar  . 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 Dental bridge present    lower  . Dental crowns present   . Ischemic colitis (HCC)   . 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 HERNIA REPAIR    . INGUINAL HERNIA REPAIR Bilateral 1980s  . TENDON RECONSTRUCTION Right 07/06/2013   Procedure: REPAIR EXTENSOR CARPI DADIALIS BREVIS, EXTENSOR CARPI DADIALIS LONGUS, MICROFRACTURE CAPITELLUM;  Surgeon: Wyn Forster., MD;  Location: [facility];  Service: Orthopedics;  Laterality: Right;    There were no vitals filed for this visit.  Subjective Assessment - 11/24/18 1536    Subjective  Pt. reporting 8/10 pain at worst with rising from initial seated position since last session.  Notes pain has been increased over this past two days.  Saw MD this morning and MD gave him muscle relaxation, and steriod pack perscription.      Pertinent History  tendinopathy of R elbow, ischemic colitis, chondromalacia of elbow, R ECRB tendon reconstruction     Diagnostic  tests  08/01/18 lumbar xray: L4-5 and L5-S1 PLIF without radiographic evidence for complication.; 11/02/18 R knee xray:  Minimal degenerative changes are present in the knee    Patient Stated Goals  more flexibility    Currently in Pain?  No/denies    Pain Score  0-No pain   pain rising to 8/10 at worst    Pain Location  Back    Pain Orientation  Left;Lower    Pain Descriptors / Indicators  Sharp    Pain Type  Surgical pain    Pain Radiating Towards  pain dodwn L lateral/posterior LE into calf     Multiple Pain Sites  No    Pain Score  0   pain rising to 7/10 "locking" pain with deep squattng    Pain Location  Knee    Pain Orientation  Right;Anterior    Pain Descriptors / Indicators  Stabbing    Pain Type  Chronic pain                       [facility] Adult PT Treatment/Exercise - 11/24/18 1557      Lumbar Exercises: Stretches   Single Knee to Chest Stretch  Right;Left;2 reps;30 seconds    Piriformis Stretch  Right;Left;2 reps;30 seconds    Piriformis Stretch Limitations  KTOS  Lumbar Exercises: Aerobic   Recumbent Bike  Lvl 2, 6 min       Lumbar Exercises: Supine   Ab Set  15 reps;5 seconds    AB Set Limitations  + glute set, and isometric hip abd/ER     Clam  15 reps;3 seconds    Clam Limitations  Hooklying alternating hip/ER into red TB     Isometric Hip Flexion  10 reps;5 seconds    Isometric Hip Flexion Limitations  UE resistance at knees with heels on peanut p-ball       Lumbar Exercises: Quadruped   Madcat/Old Horse  10 reps    Madcat/Old Horse Limitations  3" avoiding hyperextension       Electrical Stimulation   Electrical Stimulation Location  lumbar spine     Electrical Stimulation Action  IFC    Electrical Stimulation Parameters  to tolerance, 15'    Electrical Stimulation Goals  Pain      Manual Therapy   Manual Therapy  Soft tissue mobilization    Manual therapy comments  prone with 3 pillows under hips     Soft tissue mobilization  STM  to B buttock                PT Short Term Goals - 11/17/18 1546      PT SHORT TERM GOAL #1   Title  Patient to be independent with initial HEP.    Time  3    Period  Weeks    Status  Achieved    Target Date  11/24/18        PT Long Term Goals - 11/07/18 1735      PT LONG TERM GOAL #1   Title  Patient to be independent with advanced HEP.    Time  7    Period  Weeks    Status  On-going      PT LONG TERM GOAL #2   Title  Patient to demonstrate 5/5 strength in B LEs.    Time  7    Period  Weeks    Status  On-going      PT LONG TERM GOAL #3   Title  Patient to demonstrate good body mechanics and no pain when lifting 20lb box from ground.    Time  7    Period  Weeks    Status  On-going      PT LONG TERM GOAL #4   Title  Patient to demonstrate mild tightness in B HS, piriformis, hip flexor/quad.     Time  7    Period  Weeks    Status  On-going      PT LONG TERM GOAL #5   Title  Patient to return to full work duties without pain.     Time  7    Period  Weeks    Status  On-going      PT LONG TERM GOAL #6   Title  Patient to demonstrate R knee AROM symmetrical to opposite LE.     Time  7    Period  Weeks    Status  On-going      PT LONG TERM GOAL #7   Title  Patient to report 50% improvement in frequency of R knee locking with squatting activities.     Time  7    Period  Weeks    Status  On-going            Plan - 11/24/18  1553    Clinical Impression Statement  Pt. reporting increased LBP and ongoing LE radicular pain over past few days since last session.  Notes he went to MD this morning who told him he had a "flared-up" back and issued him prescription for muscle relaxers and steroid dose pack.  Tolerated advancement of lumbopelvic strengthening activities in supine well today avoiding extension- based activities as these still irritate LBP.  Ended visit with mod LBP thus applied E-stim/moist heat to lumbar spine with good relief noted following  this.  Pt. noting he is trying to avoid long periods of aerobic activity over the last few days.  Will continue to progress toward goals.      Rehab Potential  Good    Clinical Impairments Affecting Rehab Potential  tendinopathy of R elbow, ischemic colitis, chondromalacia of elbow, R ECRB tendon reconstruction     PT Treatment/Interventions  ADLs/Self Care Home Management;Cryotherapy;Electrical Stimulation;Functional mobility training;Stair training;Gait training;DME Instruction;Ultrasound;Moist Heat;Therapeutic activities;Therapeutic exercise;Balance training;Neuromuscular re-education;Patient/family education;Orthotic Fit/Training;Passive range of motion;Scar mobilization;Manual techniques;Dry needling;Energy conservation;Splinting;Taping;Vasopneumatic Device;Iontophoresis 4mg /ml Dexamethasone    PT Next Visit Plan  Progress LE strengthening, stretching, lumbopelvic ROM    Consulted and Agree with Plan of Care  Patient       Patient will benefit from skilled therapeutic intervention in order to improve the following deficits and impairments:  Decreased activity tolerance, Decreased strength, Pain, Decreased balance, Increased muscle spasms, Improper body mechanics, Decreased range of motion, Impaired flexibility, Postural dysfunction  Visit Diagnosis: Acute midline low back pain without sciatica  Other symptoms and signs involving the musculoskeletal system  Muscle weakness (generalized)  Chronic pain of right knee  Stiffness of right knee, not elsewhere classified     Problem List Patient Active Problem List   Diagnosis Date Noted  . Lumbar foraminal stenosis 08/01/2018    Kermit Balo, PTA 11/24/18 5:17 PM   [facility] Outpatient [facility] [address]  [address] [place], [place], [address] Phone: [phone]   Fax:  [phone]  Name: Kyle Ford MRN: [id number] Date of Birth: 1966/05/05

## 2018-11-28 ENCOUNTER — Other Ambulatory Visit: Payer: Self-pay

## 2018-11-28 ENCOUNTER — Ambulatory Visit: Payer: BLUE CROSS/BLUE SHIELD | Admitting: Physical Therapy

## 2018-11-28 ENCOUNTER — Encounter: Payer: Self-pay | Admitting: Physical Therapy

## 2018-11-28 DIAGNOSIS — R29898 Other symptoms and signs involving the musculoskeletal system: Secondary | ICD-10-CM | POA: Diagnosis not present

## 2018-11-28 DIAGNOSIS — M25661 Stiffness of right knee, not elsewhere classified: Secondary | ICD-10-CM

## 2018-11-28 DIAGNOSIS — M25561 Pain in right knee: Secondary | ICD-10-CM | POA: Diagnosis not present

## 2018-11-28 DIAGNOSIS — M545 Low back pain, unspecified: Secondary | ICD-10-CM

## 2018-11-28 DIAGNOSIS — M6281 Muscle weakness (generalized): Secondary | ICD-10-CM

## 2018-11-28 DIAGNOSIS — G8929 Other chronic pain: Secondary | ICD-10-CM

## 2018-11-28 NOTE — Therapy (Signed)
[facility] [address]  [place], [place], [address] Phone: [phone]   Fax:  [phone]  Physical Therapy Progress Note  Patient Details  Name: Kyle Ford MRN: [id number] Date of Birth: 1966-01-07 Referring Provider (PT): Karlton Lemon, MD   Encounter Date: 11/28/2018  PT End of Session - 11/28/18 1722    Visit Number  10    Number of Visits  17    Date for PT Re-Evaluation  12/22/18    Authorization Type  BCBS: VL 30    PT Start Time  1533    PT Stop Time  1613    PT Time Calculation (min)  40 min    Activity Tolerance  Patient tolerated treatment well;Patient limited by pain    Behavior During Therapy  [facility] for tasks assessed/performed       Past Medical History:  Diagnosis Date  . Arthritis   . Bulging discs    lumbar  . 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 Dental bridge present    lower  . Dental crowns present   . Ischemic colitis ([place])   . 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 HERNIA REPAIR    . INGUINAL HERNIA REPAIR Bilateral 1980s  . TENDON RECONSTRUCTION Right 07/06/2013   Procedure: REPAIR EXTENSOR CARPI DADIALIS BREVIS, EXTENSOR CARPI DADIALIS LONGUS, MICROFRACTURE CAPITELLUM;  Surgeon: Cammie Sickle., MD;  Location: [facility];  Service: Orthopedics;  Laterality: Right;    There were no vitals filed for this visit.  Subjective Assessment - 11/28/18 1536    Subjective  Reports that has been taking his steroids and has noticed improvement in L buttock/LB, now noticing mild pain in R LB. Reports that he has noticed an increase in pain since starting PT. Was able to walk 4 miles on Saturday and again on Sunday with only mild pain. Still having pain with sit to stand transfers and walking. N/T in L LE has resolved.     Pertinent  History  tendinopathy of R elbow, ischemic colitis, chondromalacia of elbow, R ECRB tendon reconstruction     Diagnostic tests  08/01/18 lumbar xray: L4-5 and L5-S1 PLIF without radiographic evidence for complication.; 28/78/67 R knee xray:  Minimal degenerative changes are present in the knee    Patient Stated Goals  more flexibility    Currently in Pain?  Yes    Pain Score  1     Pain Location  Back    Pain Orientation  Right;Lower    Pain Descriptors / Indicators  Dull    Pain Type  Acute pain         [facility] PT Assessment - 11/28/18 0001      AROM   Right Knee Extension  -1    Right Knee Flexion  133      Strength   Right Hip Flexion  4+/5    Right Hip ABduction  4+/5    Right Hip ADduction  4/5    Left Hip Flexion  4+/5    Left Hip ABduction  4+/5    Left Hip ADduction  4/5    Right Knee Flexion  4+/5    Right Knee Extension  4+/5    Left Knee Flexion  4+/5    Left Knee Extension  4+/5    Right  Ankle Dorsiflexion  4+/5    Left Ankle Dorsiflexion  4+/5      Flexibility   Hamstrings  B moderately tight- worse on L    Quadriceps  WFL B sides    Piriformis  B mildly tight                   [facility] Adult PT Treatment/Exercise - 11/28/18 0001      Self-Care   Self-Care  Lifting    Lifting  edu and practice lifting pen and lightweight stool from ground- patient with good mechanics and only feeling of "stiffness in LB" no knee pain      Lumbar Exercises: Stretches   Passive Hamstring Stretch  Right;Left;1 rep;30 seconds    Passive Hamstring Stretch Limitations  strap supine    Hip Flexor Stretch  Right;Left;1 rep;30 seconds    Hip Flexor Stretch Limitations  mod thomas stretch with strap    Piriformis Stretch  Right;Left;30 seconds;1 rep    Piriformis Stretch Limitations  KTOS      Lumbar Exercises: Aerobic   Nustep  L4 x 92mn UEs/LEs       Lumbar Exercises: Supine   Dead Bug  20 reps    Dead Bug Limitations  cues for core contraction and rhythmic  breahting      Knee/Hip Exercises: Sidelying   Hip ADduction  Strengthening;Right;Left;1 set;10 reps    Hip ADduction Limitations  opposite LE elevated on bolster   cues to tighten core            PT Education - 11/28/18 1630    Education Details  discussion on objective progress with PT; update to HEP    Person(s) Educated  Patient    Methods  Explanation;Demonstration;Tactile cues;Verbal cues;Handout    Comprehension  Verbalized understanding;Returned demonstration       PT Short Term Goals - 11/28/18 1538      PT SHORT TERM GOAL #1   Title  Patient to be independent with initial HEP.    Time  3    Period  Weeks    Status  Achieved    Target Date  11/24/18        PT Long Term Goals - 11/28/18 1539      PT LONG TERM GOAL #1   Title  Patient to be independent with advanced HEP.    Time  7    Period  Weeks    Status  Partially Met   met for current- avoiding bridges d/t LBP     PT LONG TERM GOAL #2   Title  Patient to demonstrate 5/5 strength in B LEs.    Time  7    Period  Weeks    Status  On-going   showing improvements in B hip abduction, L knee flexion, R DF strength; most limited in hip adduction strength     PT LONG TERM GOAL #3   Title  Patient to demonstrate good body mechanics and no pain when lifting 20lb box from ground.    Time  7    Period  Weeks    Status  Partially Met   patient with good mechanics- no knee pain and only feeling of "stiffness in LB" when lifting lightweight objects from ground     PT LONG TERM GOAL #4   Title  Patient to demonstrate mild tightness in B HS, piriformis, hip flexor/quad.     Time  7    Period  Weeks  Status  Partially Met   improvement demonstrated in all muscle groups- still most limited in HS and piriformis flexibility     PT LONG TERM GOAL #5   Title  Patient to return to full work duties without pain.     Time  7    Period  Weeks    Status  On-going   still on lifting restriction at work.       PT LONG TERM GOAL #6   Title  Patient to demonstrate R knee AROM symmetrical to opposite LE.     Time  7    Period  Weeks    Status  Achieved   demonstrated R knee AROM -1-133 degrees without pain      PT LONG TERM GOAL #7   Title  Patient to report 50% improvement in frequency of R knee locking with squatting activities.     Time  7    Period  Weeks    Status  On-going   reports same frequency of R knee locking           Plan - 11/28/18 1733    Clinical Impression Statement  Patient arrived to session with report of improvement in L LBP with help of steroid pack, however now noticing mild R LBP. No radicular symptoms today. Noting that he notes worsening of LBP since starting therapy, and notices no improvement in R knee pain. Patient now still having pain with STS transfers and ambulation- today noting this on R side only. Patient has shown improvements in B hip abduction, L knee flexion, R DF strength; now most limited in hip adduction strength. LE flexibility improved in all muscle groups- still most limited in HS and piriformis flexibility, worse on L. Patient with good mechanics- no knee pain and only feeling of "stiffness in LB" when lifting lightweight objects from ground. Patient has reached R knee AROM -1-133 degrees without pain, now meeting knee ROM goal. However, reports the same frequency of R knee locking with deep squatting activities at home. Updated HEP to address patient's remaining adductor weakness- patient noting improved tolerance for sidelying hip adduction with cues to contract core. Plan to continue addressing core strength and this is likely contributing to LBP. Patient showing good progress with PT thus far, somewhat limited by LBP flare up. Plan to progress per POC to address remaining goals.     Clinical Impairments Affecting Rehab Potential  tendinopathy of R elbow, ischemic colitis, chondromalacia of elbow, R ECRB tendon reconstruction     PT  Treatment/Interventions  ADLs/Self Care Home Management;Cryotherapy;Electrical Stimulation;Functional mobility training;Stair training;Gait training;DME Instruction;Ultrasound;Moist Heat;Therapeutic activities;Therapeutic exercise;Balance training;Neuromuscular re-education;Patient/family education;Orthotic Fit/Training;Passive range of motion;Scar mobilization;Manual techniques;Dry needling;Energy conservation;Splinting;Taping;Vasopneumatic Device;Iontophoresis '4mg'$ /ml Dexamethasone    PT Next Visit Plan  Progress LE strengthening, stretching, lumbopelvic ROM    Consulted and Agree with Plan of Care  Patient       Patient will benefit from skilled therapeutic intervention in order to improve the following deficits and impairments:  Decreased activity tolerance, Decreased strength, Pain, Decreased balance, Increased muscle spasms, Improper body mechanics, Decreased range of motion, Impaired flexibility, Postural dysfunction  Visit Diagnosis: Acute midline low back pain without sciatica  Other symptoms and signs involving the musculoskeletal system  Muscle weakness (generalized)  Chronic pain of right knee  Stiffness of right knee, not elsewhere classified     Problem List Patient Active Problem List   Diagnosis Date Noted  . Lumbar foraminal stenosis 08/01/2018     Janene Harvey,  PT, DPT 11/28/18 5:36 PM   [facility] [address]  [place], [place], [address] Phone: [phone]   Fax:  [phone]  Name: ULMER DEGEN MRN: [id number] Date of Birth: 1966-07-07

## 2018-12-01 ENCOUNTER — Ambulatory Visit: Payer: BLUE CROSS/BLUE SHIELD

## 2018-12-01 ENCOUNTER — Other Ambulatory Visit: Payer: Self-pay

## 2018-12-01 DIAGNOSIS — M545 Low back pain, unspecified: Secondary | ICD-10-CM

## 2018-12-01 DIAGNOSIS — M25561 Pain in right knee: Secondary | ICD-10-CM

## 2018-12-01 DIAGNOSIS — G8929 Other chronic pain: Secondary | ICD-10-CM

## 2018-12-01 DIAGNOSIS — R29898 Other symptoms and signs involving the musculoskeletal system: Secondary | ICD-10-CM

## 2018-12-01 DIAGNOSIS — M6281 Muscle weakness (generalized): Secondary | ICD-10-CM

## 2018-12-01 DIAGNOSIS — M25661 Stiffness of right knee, not elsewhere classified: Secondary | ICD-10-CM | POA: Diagnosis not present

## 2018-12-01 NOTE — Therapy (Addendum)
[facility] [address]  [place], [place], [address] Phone: [phone]   Fax:  [phone]  Physical Therapy Treatment  Patient Details  Name: Kyle Ford MRN: [id number] Date of Birth: Oct 13, 1965 Referring Provider (PT): Karlton Lemon, MD   Encounter Date: 12/01/2018  PT End of Session - 12/01/18 0855    Visit Number  11    Number of Visits  17    Date for PT Re-Evaluation  12/22/18    Authorization Type  BCBS: VL 30    PT Start Time  0845    PT Stop Time  1117   ended visit with 10 min moist heat    PT Time Calculation (min)  66 min    Activity Tolerance  Patient tolerated treatment well;Patient limited by pain    Behavior During Therapy  [facility] for tasks assessed/performed       Past Medical History:  Diagnosis Date  . Arthritis   . Bulging discs    lumbar  . 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 Dental bridge present    lower  . Dental crowns present   . Ischemic colitis ([place])   . 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 HERNIA REPAIR    . INGUINAL HERNIA REPAIR Bilateral 1980s  . TENDON RECONSTRUCTION Right 07/06/2013   Procedure: REPAIR EXTENSOR CARPI DADIALIS BREVIS, EXTENSOR CARPI DADIALIS LONGUS, MICROFRACTURE CAPITELLUM;  Surgeon: Cammie Sickle., MD;  Location: [facility];  Service: Orthopedics;  Laterality: Right;    There were no vitals filed for this visit.  Subjective Assessment - 12/01/18 0848    Subjective  Pt. noting he still feels he is getting relief from steriod pack and muscle relaxants.  Has not been feeling L LE radicular symptoms last few days.      Pertinent History  tendinopathy of R elbow, ischemic colitis, chondromalacia of elbow, R ECRB tendon reconstruction     Diagnostic tests  08/01/18 lumbar xray: L4-5 and L5-S1 PLIF  without radiographic evidence for complication.; 35/67/01 R knee xray:  Minimal degenerative changes are present in the knee    Patient Stated Goals  more flexibility    Currently in Pain?  Yes    Pain Score  2    Average 1/10 back pain    Pain Location  Back    Pain Orientation  Right;Lower    Pain Descriptors / Indicators  Dull    Pain Type  Acute pain    Pain Radiating Towards  denies today      Multiple Pain Sites  No    Pain Score  0   up to 7/10 "locking" pain with deep squatting    Pain Location  Knee    Pain Orientation  Right;Anterior    Pain Type  Chronic pain    Pain Onset  More than a month ago                       [facility] Adult PT Treatment/Exercise - 12/01/18 0858      Lumbar Exercises: Stretches   Passive Hamstring Stretch  Right;Left;2 reps;30 seconds    Passive Hamstring Stretch Limitations  strap supine    ITB Stretch  Right;Left;1 rep;30 seconds    ITB Stretch Limitations  strap     Piriformis  Stretch  Right;Left;30 seconds;1 rep    Piriformis Stretch Limitations  KTOS    Other Lumbar Stretch Exercise  B hooklying sciatic nerve glides x 10 resp with towel assistance       Lumbar Exercises: Aerobic   Nustep  L4 x 22mn UEs/LEs       Lumbar Exercises: Machines for Strengthening   Cybex Knee Flexion  B LE's 25# x 10 reps; B con/R ecc 20# x 15 reps       Lumbar Exercises: Seated   Hip Flexion on Ball  Left;Right   x 12 reps    Hip Flexion on Ball Limitations  seated on green p-ball     Other Seated Lumbar Exercises  B pallof press with green TB seated on green p-ball + abdom brace x 15 rpes each way; narrow stance       Knee/Hip Exercises: Standing   Hip Flexion  Right;Left;Knee straight   x 12 reps; cues for abdom bracing    Hip Flexion Limitations  yellow TB at ankle; 1 ski poles     Hip ADduction  Right;Left;Strengthening   x 12 reps; cues for abdom bracing    Hip ADduction Limitations  yellow TB at ankle; 1 ski poles     Hip Abduction   Right;Left;Knee straight   x 12 reps; cues for abdom bracing    Abduction Limitations  yellow TB at ankle; 1 ski poles     Hip Extension  Right;Left;Knee straight;Stengthening   x 12 reps; cues for abdom bracing    Extension Limitations  yellow TB at ankle; 1 ski poles       Electrical Stimulation   Electrical Stimulation Location  lumbar spine     Electrical Stimulation Action  IFC    Electrical Stimulation Parameters  to tolerance, 10'    Electrical Stimulation Goals  Pain               PT Short Term Goals - 11/28/18 1538      PT SHORT TERM GOAL #1   Title  Patient to be independent with initial HEP.    Time  3    Period  Weeks    Status  Achieved    Target Date  11/24/18        PT Long Term Goals - 11/28/18 1539      PT LONG TERM GOAL #1   Title  Patient to be independent with advanced HEP.    Time  7    Period  Weeks    Status  Partially Met   met for current- avoiding bridges d/t LBP     PT LONG TERM GOAL #2   Title  Patient to demonstrate 5/5 strength in B LEs.    Time  7    Period  Weeks    Status  On-going   showing improvements in B hip abduction, L knee flexion, R DF strength; most limited in hip adduction strength     PT LONG TERM GOAL #3   Title  Patient to demonstrate good body mechanics and no pain when lifting 20lb box from ground.    Time  7    Period  Weeks    Status  Partially Met   patient with good mechanics- no knee pain and only feeling of "stiffness in LB" when lifting lightweight objects from ground     PT LONG TERM GOAL #4   Title  Patient to demonstrate mild tightness in B HS, piriformis, hip  flexor/quad.     Time  7    Period  Weeks    Status  Partially Met   improvement demonstrated in all muscle groups- still most limited in HS and piriformis flexibility     PT LONG TERM GOAL #5   Title  Patient to return to full work duties without pain.     Time  7    Period  Weeks    Status  On-going   still on lifting restriction  at work.      PT LONG TERM GOAL #6   Title  Patient to demonstrate R knee AROM symmetrical to opposite LE.     Time  7    Period  Weeks    Status  Achieved   demonstrated R knee AROM -1-133 degrees without pain      PT LONG TERM GOAL #7   Title  Patient to report 50% improvement in frequency of R knee locking with squatting activities.     Time  7    Period  Weeks    Status  On-going   reports same frequency of R knee locking           Plan - 12/01/18 0855    Clinical Impression Statement  Pt. reporting his back pain has been somewhat better and not feeling L LE radicular pain over past few days.  Attributes his relief from recent steroid and muscle relaxer medications.  Tolerated progression of lumbopelvic strengthening with standing 4-way hip kicker well today targeting remaining proximal hip weakness.  Ended visit with LE stretching and application of moist heat to lumbar spine with pt. leaving session with LBP returning to baseline level.      Rehab Potential  Good    Clinical Impairments Affecting Rehab Potential  tendinopathy of R elbow, ischemic colitis, chondromalacia of elbow, R ECRB tendon reconstruction     PT Treatment/Interventions  ADLs/Self Care Home Management;Cryotherapy;Electrical Stimulation;Functional mobility training;Stair training;Gait training;DME Instruction;Ultrasound;Moist Heat;Therapeutic activities;Therapeutic exercise;Balance training;Neuromuscular re-education;Patient/family education;Orthotic Fit/Training;Passive range of motion;Scar mobilization;Manual techniques;Dry needling;Energy conservation;Splinting;Taping;Vasopneumatic Device;Iontophoresis '4mg'$ /ml Dexamethasone    PT Next Visit Plan  Progress LE strengthening, stretching, lumbopelvic ROM    Consulted and Agree with Plan of Care  Patient       Patient will benefit from skilled therapeutic intervention in order to improve the following deficits and impairments:  Decreased activity tolerance,  Decreased strength, Pain, Decreased balance, Increased muscle spasms, Improper body mechanics, Decreased range of motion, Impaired flexibility, Postural dysfunction  Visit Diagnosis: Acute midline low back pain without sciatica  Other symptoms and signs involving the musculoskeletal system  Muscle weakness (generalized)  Chronic pain of right knee  Stiffness of right knee, not elsewhere classified     Problem List Patient Active Problem List   Diagnosis Date Noted  . Lumbar foraminal stenosis 08/01/2018    Bess Harvest, PTA 12/01/18 10:59 AM   [facility] [address]  [place], [place], [address] Phone: [phone]   Fax:  [phone]  Name: Kyle Ford MRN: [id number] Date of Birth: Mar 23, 1966  PHYSICAL THERAPY DISCHARGE SUMMARY  Visits from Start of Care: 11  Current functional level related to goals / functional outcomes: Unable to assess; patient did not return since last session d/t concern about COVID-19   Remaining deficits: Unable to assess   Education / Equipment: HEP  Plan: Patient agrees to discharge.  Patient goals were partially met. Patient is being discharged due to not  returning since the last visit.  ?????     Janene Harvey, PT, DPT 01/10/19 1:28 PM

## 2018-12-05 ENCOUNTER — Encounter: Payer: Self-pay | Admitting: Physical Therapy

## 2018-12-05 ENCOUNTER — Ambulatory Visit: Payer: BLUE CROSS/BLUE SHIELD

## 2018-12-08 ENCOUNTER — Encounter: Payer: BLUE CROSS/BLUE SHIELD | Admitting: Physical Therapy

## 2018-12-19 ENCOUNTER — Ambulatory Visit: Payer: BLUE CROSS/BLUE SHIELD | Admitting: Physical Therapy

## 2018-12-26 ENCOUNTER — Ambulatory Visit: Payer: BLUE CROSS/BLUE SHIELD | Admitting: Family Medicine

## 2019-02-01 DIAGNOSIS — M47817 Spondylosis without myelopathy or radiculopathy, lumbosacral region: Secondary | ICD-10-CM | POA: Diagnosis not present

## 2019-02-07 DIAGNOSIS — B351 Tinea unguium: Secondary | ICD-10-CM | POA: Diagnosis not present

## 2019-02-07 DIAGNOSIS — E781 Pure hyperglyceridemia: Secondary | ICD-10-CM | POA: Diagnosis not present

## 2019-02-07 DIAGNOSIS — Z7689 Persons encountering health services in other specified circumstances: Secondary | ICD-10-CM | POA: Diagnosis not present

## 2019-08-17 IMAGING — CR DG KNEE AP/LAT W/ SUNRISE*R*
3 series · 3 of 3 positions shown · non-contrast
Comparison: None.

CLINICAL DATA: Chronic right knee pain.

EXAM:
RIGHT KNEE 3 VIEWS

[w knee tunnel right *]
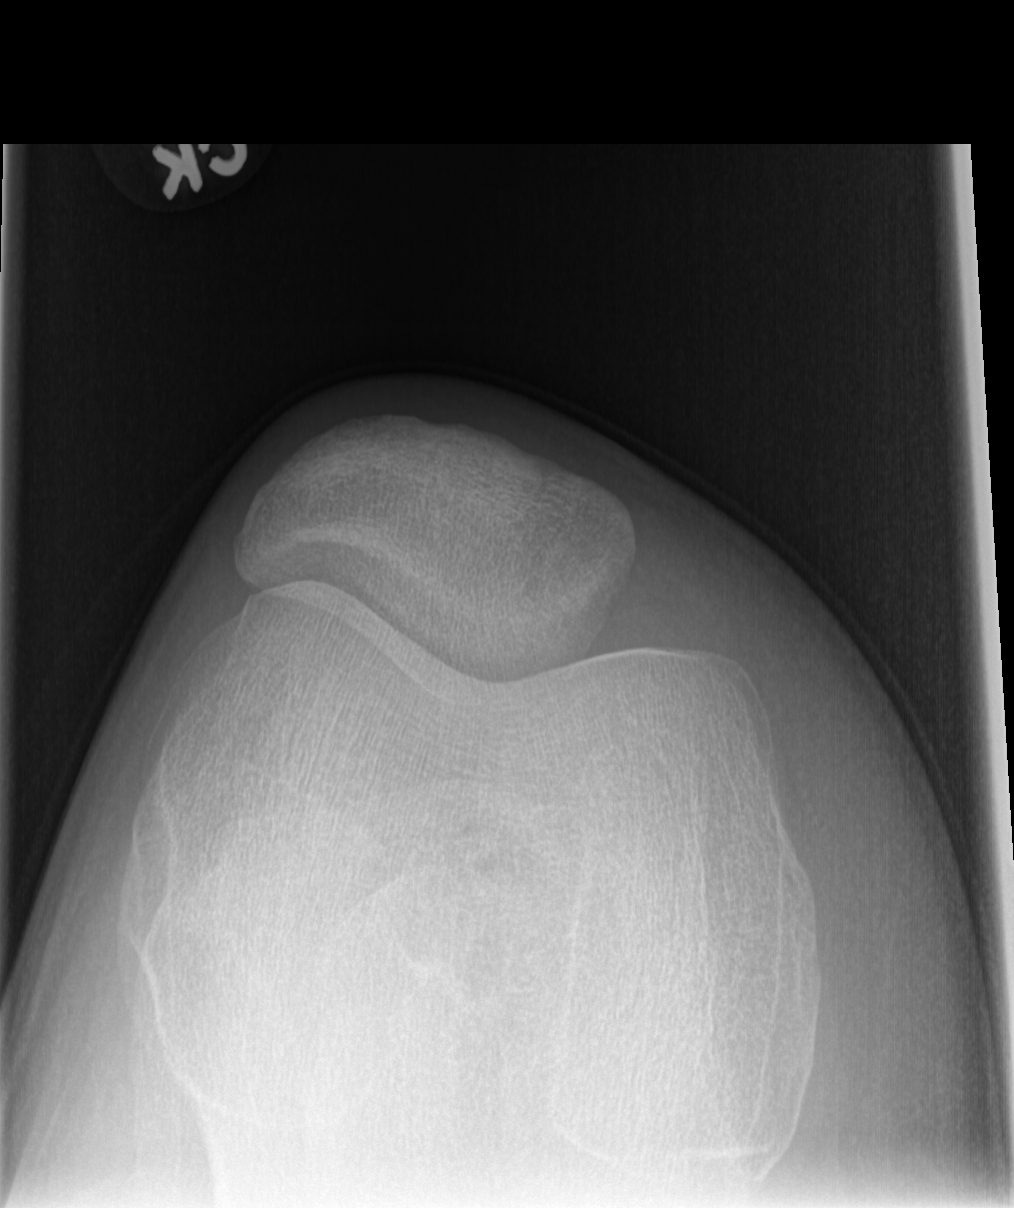

[w knee ap right *]
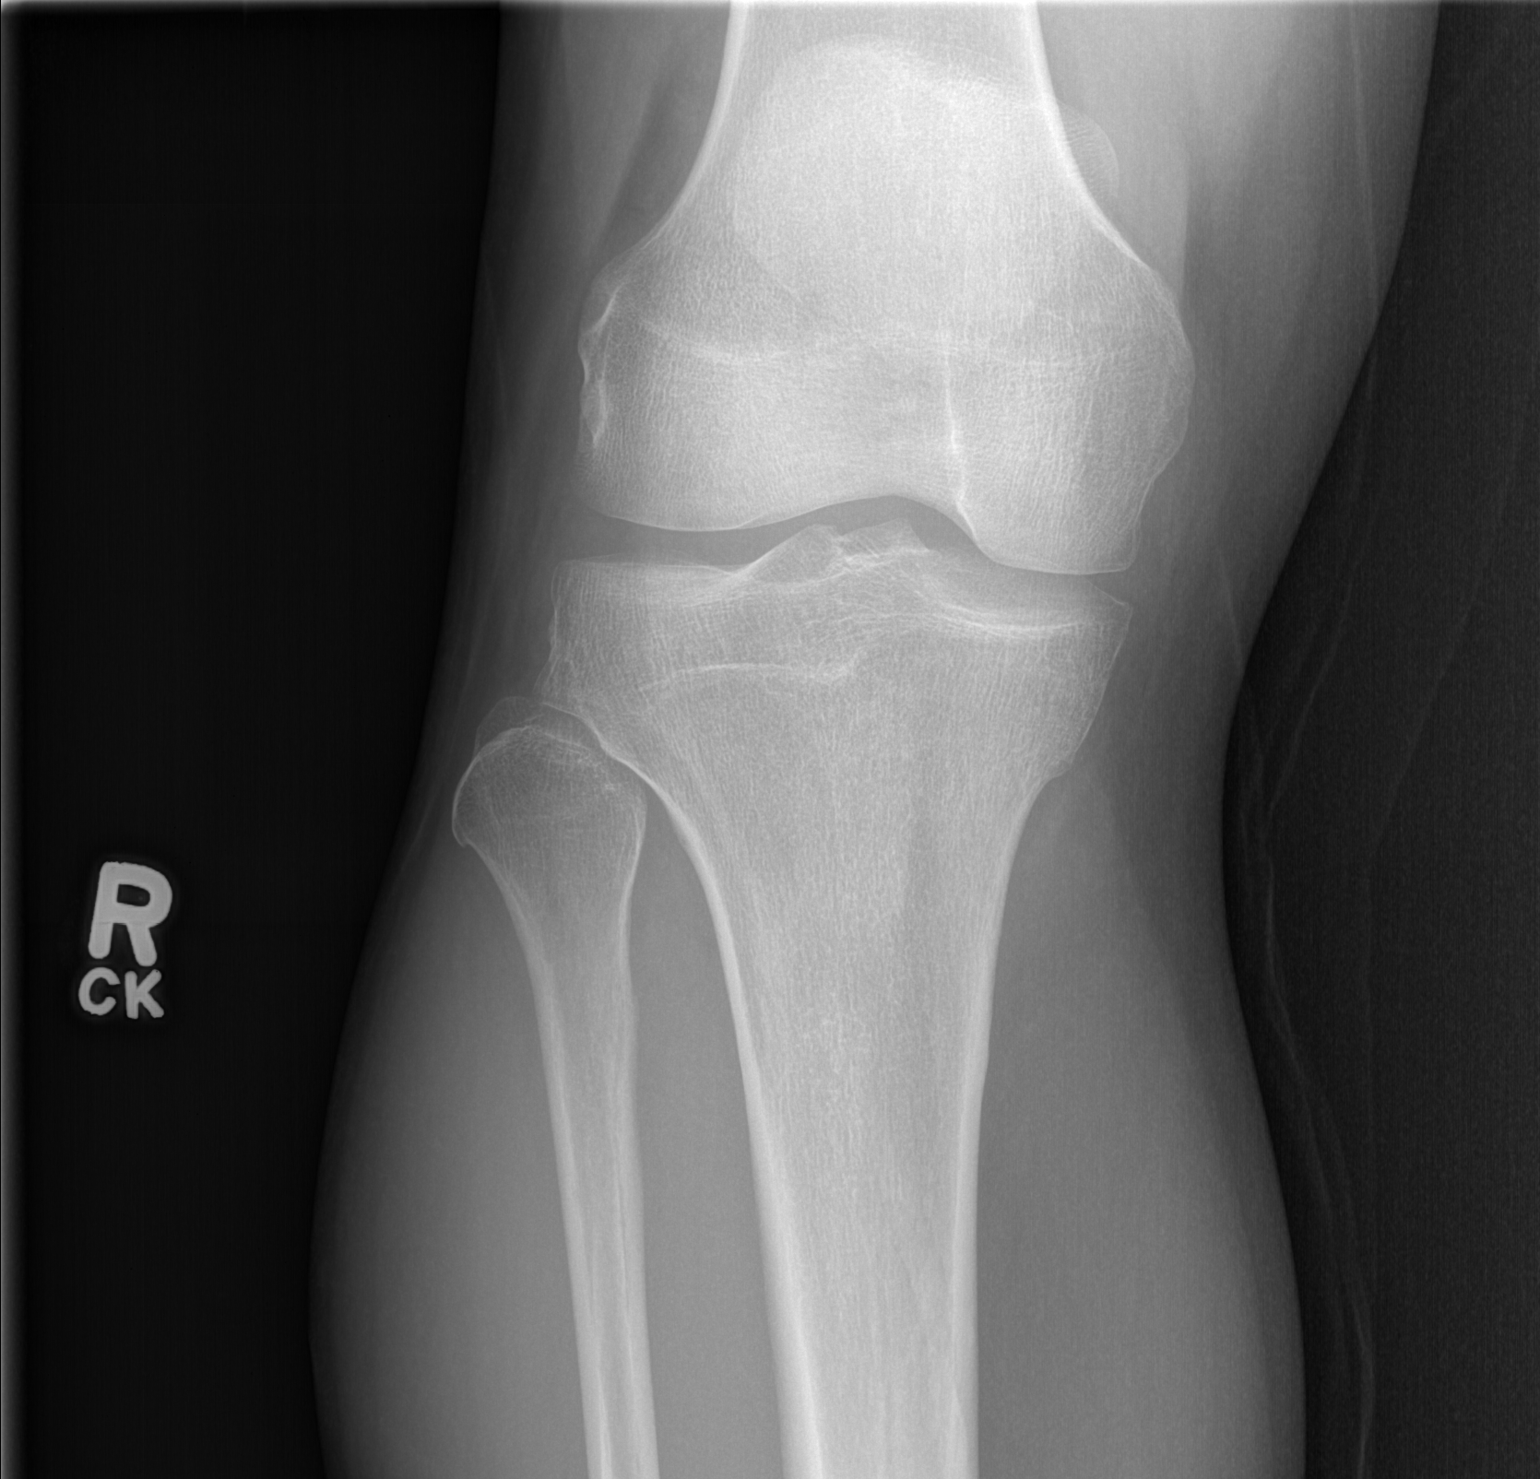

[w knee lat. right *]
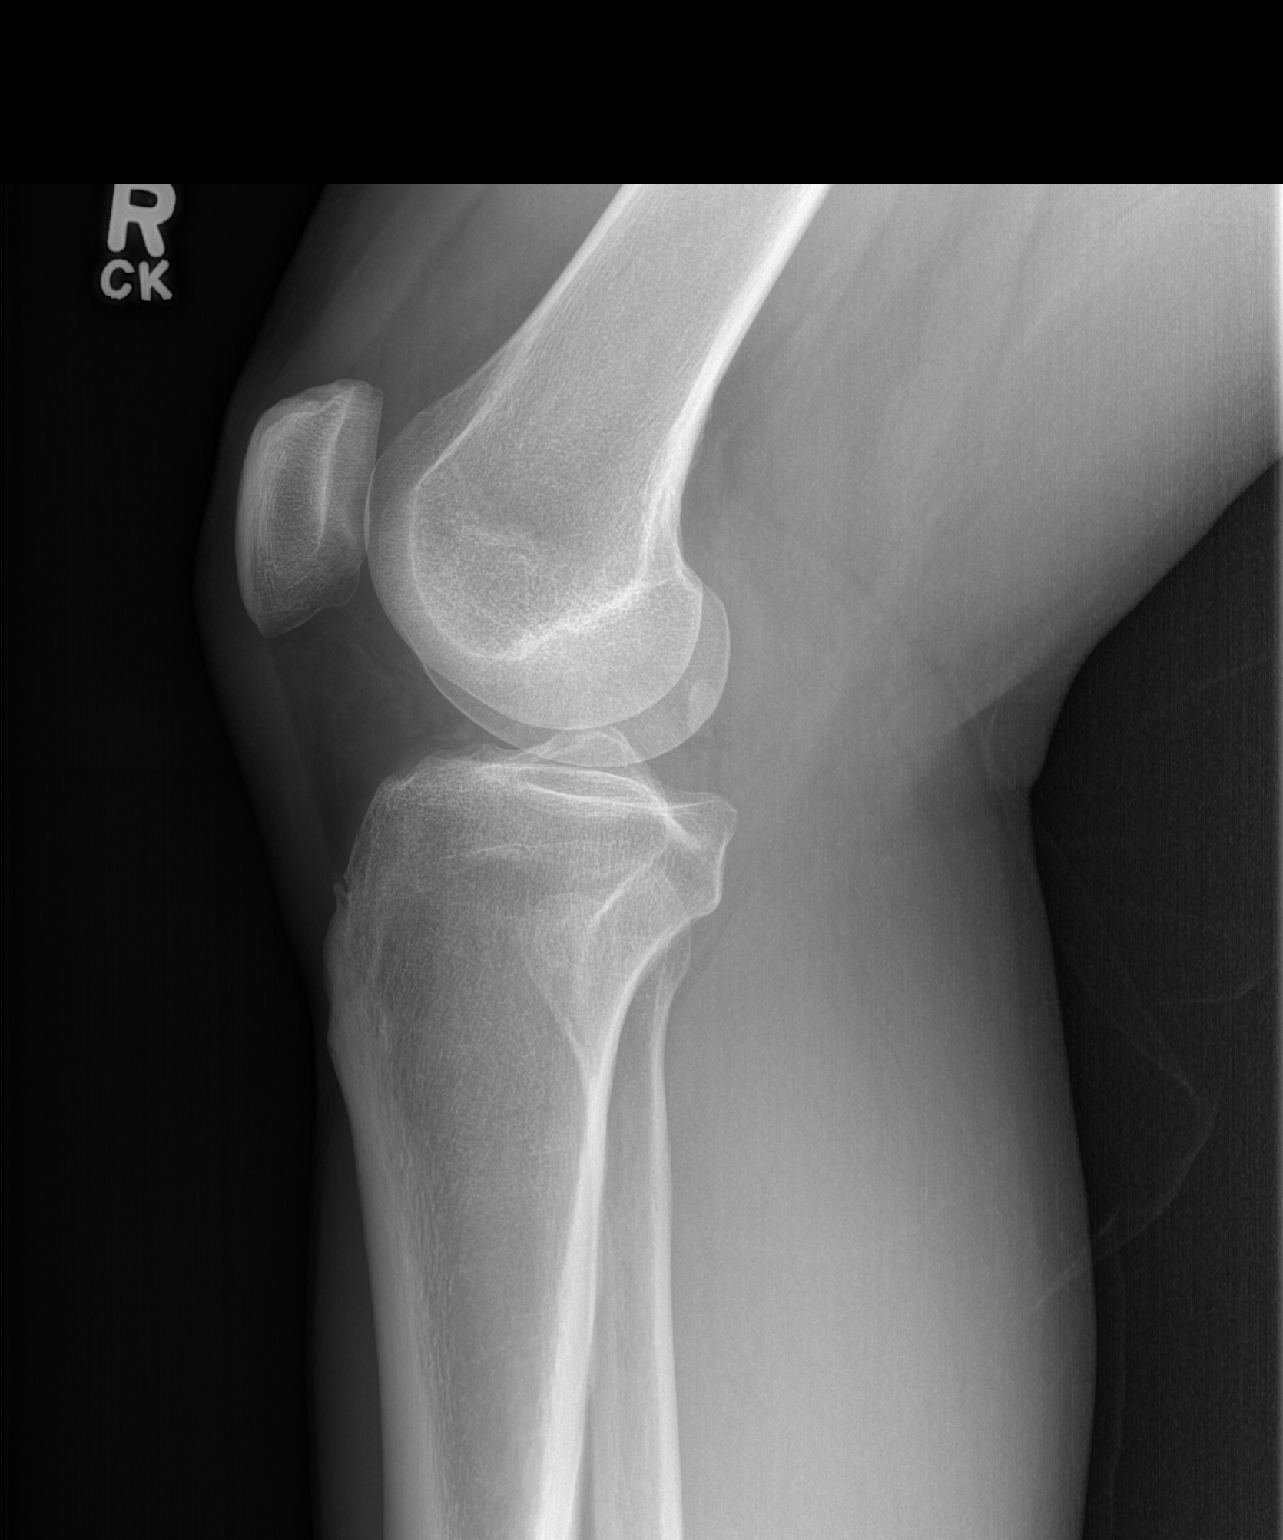

[3 of 3 positions shown; findings below may reference images not displayed]

FINDINGS: The right knee is located. There is minimal loss of joint space in
the medial compartment of the knee and the lateral aspect of the
patellofemoral joint. No acute abnormalities are present. There is
no effusion.
IMPRESSION: 1. Minimal degenerative changes are present in the knee.
2. No acute abnormalities.

## 2021-07-02 ENCOUNTER — Ambulatory Visit (INDEPENDENT_AMBULATORY_CARE_PROVIDER_SITE_OTHER): Payer: No Typology Code available for payment source | Admitting: Orthopaedic Surgery

## 2021-07-02 ENCOUNTER — Ambulatory Visit: Payer: Self-pay

## 2021-07-02 ENCOUNTER — Other Ambulatory Visit: Payer: Self-pay

## 2021-07-02 DIAGNOSIS — M25561 Pain in right knee: Secondary | ICD-10-CM

## 2021-07-02 DIAGNOSIS — G8929 Other chronic pain: Secondary | ICD-10-CM | POA: Diagnosis not present

## 2021-07-02 MED ORDER — METHYLPREDNISOLONE ACETATE 40 MG/ML IJ SUSP
40.0000 mg | INTRAMUSCULAR | Status: AC | PRN
  Administered 2021-07-02: 40 mg via INTRA_ARTICULAR

## 2021-07-02 MED ORDER — LIDOCAINE HCL 1 % IJ SOLN
3.0000 mL | INTRAMUSCULAR | Status: AC | PRN
  Administered 2021-07-02: 3 mL

## 2021-07-02 NOTE — Progress Notes (Signed)
Office Visit Note   [name]           Date of Birth: [birth year]           MRN: [id number] Visit Date: 07/02/2021              Requested by: Loyal Jacobson, MD [address] [place],  [place] [address] PCP: Loyal Jacobson, MD   Assessment & Plan: Visit Diagnoses:  1. Chronic pain of right knee     Plan: Since he has not had any type of intervention of the knee such as a steroid injection, I recommend a steroid injection in the knee joint and along the medial aspect of his knee where he is having a symptomatic soft tissue issue.  He agreed to this treatment plan and I placed 2 injections around the right knee with 1 deep and 1 superficial.  He tolerated these well.  We will see him back in about 5 weeks to see how he is doing overall.  I would certainly be an advocate of a repeat MRI of that right knee if he is still symptomatic.  All questions and concerns were answered and addressed.  Follow-Up Instructions: Return in about 5 weeks (around 08/06/2021).   Orders:  Orders Placed This Encounter  Procedures   Large Joint Inj: R knee   XR Knee 1-2 Views Right   No orders of the defined types were placed in this encounter.     Procedures: Large Joint Inj: R knee on 07/02/2021 4:38 PM Indications: diagnostic evaluation and pain Details: 22 G 1.5 in needle, superolateral approach  Arthrogram: No  Medications: 3 mL lidocaine 1 %; 40 mg methylPREDNISolone acetate 40 MG/ML Outcome: tolerated well, no immediate complications Procedure, treatment alternatives, risks and benefits explained, specific risks discussed. Consent was given by the patient. Immediately prior to procedure a time out was called to verify the correct patient, procedure, equipment, support staff and site/side marked as required. Patient was prepped and draped in the usual sterile fashion.      Clinical Data: No additional findings.   Subjective: Chief Complaint  Patient presents  with   Right Knee - Pain  The patient is someone I am seeing for the first time to evaluate and treat right knee pain that is mainly along the medial aspect of his knee.  His biggest complaint is that he cannot fully squat down on his right side which he can on his left side.  There is been no known injury and he had an MRI of his right knee in 2020 that was negative for any type of internal derangement.  There was only some slight medial joint space narrowing and arthritis.  There is no meniscal tear or ligamentous disruption of the knee.  He has never had any type of injection in that knee including a steroid injection.  He is a thin individual and is active.  He is never had surgery on the knee but it is definitely affecting his quality of life and his mobility with the pain that he is experiencing on that medial aspect of his knee especially with the fact that he cannot squat down on that side and has to change the positioning for comfort.  He did demonstrate squatting down to me and show me the medial aspect of his knee that causes severe pain when he does that motion.  He is not a diabetic.  HPI  Review of Systems There is currently  listed no headache, chest pain, shortness of breath, fever, chills, nausea, vomiting  Objective: Vital Signs: There were no vitals taken for this visit.  Physical Exam He is alert and orient x3 and in no acute distress Ortho Exam Examination of his right knee shows some medial joint line tenderness and pain along the medial collateral ligament area.  He may have a symptomatic plica that I am feeling on that knee as well.  His range of motion is full and his McMurray's and Lachman's exams are completely negative. Specialty Comments:  No specialty comments available.  Imaging: XR Knee 1-2 Views Right  Result Date: 07/02/2021 2 views of the right knee showed no acute findings.  The joint spaces still well-maintained.  There is no effusion.    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  Past Surgical History:  Procedure Laterality Date   HERNIA REPAIR     INGUINAL HERNIA REPAIR Bilateral 1980s   TENDON RECONSTRUCTION Right 07/06/2013   Procedure: REPAIR EXTENSOR CARPI DADIALIS BREVIS, EXTENSOR CARPI DADIALIS LONGUS, MICROFRACTURE CAPITELLUM;  Surgeon: Wyn Forster., MD;  Location: [facility];  Service: Orthopedics;  Laterality: Right;   Social History   Occupational History   Not on file  Tobacco Use   Smoking status: Never   Smokeless tobacco: Never  Vaping Use   Vaping Use: Never used  Substance and Sexual Activity   Alcohol use: No   Drug use: No   Sexual activity: Not on file

## 2021-08-06 ENCOUNTER — Ambulatory Visit: Payer: No Typology Code available for payment source | Admitting: Orthopaedic Surgery
